# Patient Record
Sex: Male | Born: 1990 | Hispanic: No | Smoking: Current every day smoker
Health system: Southern US, Community
[De-identification: ages and names within clinical notes are randomized; demographics above are authoritative.]

## PROBLEM LIST (undated history)

## (undated) DIAGNOSIS — F419 Anxiety disorder, unspecified: Secondary | ICD-10-CM

## (undated) HISTORY — PX: NO PAST SURGERIES: SHX2092

---
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
## 2017-08-23 ENCOUNTER — Other Ambulatory Visit: Payer: Self-pay

## 2017-08-23 ENCOUNTER — Encounter (HOSPITAL_COMMUNITY): Payer: Self-pay | Admitting: Emergency Medicine

## 2017-08-23 ENCOUNTER — Ambulatory Visit (HOSPITAL_COMMUNITY)
Admission: EM | Admit: 2017-08-23 | Discharge: 2017-08-23 | Disposition: A | Discharge status: Home / Self Care | Payer: BLUE CROSS/BLUE SHIELD | Attending: Family Medicine | Admitting: Family Medicine

## 2017-08-23 DIAGNOSIS — M79672 Pain in left foot: Secondary | ICD-10-CM

## 2017-08-23 HISTORY — DX: Anxiety disorder, unspecified: F41.9

## 2017-08-23 MED ORDER — MELOXICAM 7.5 MG PO TABS: 7.5000 mg | ORAL_TABLET | Freq: Every day | ORAL | 0 refills | Status: DC

## 2017-08-23 NOTE — Discharge Instructions (Signed)
Start mobic for pain and inflammation. Start ice compress, elevation as well. You may need better supportive shoes to help with the symptoms. Follow up with PCP/orthopedics for further evaluation if symptoms do not improve.

## 2017-08-23 NOTE — ED Provider Notes (Signed)
[facility]    CSN: [id number] Arrival date & time: 08/23/17  1615     History   Chief Complaint Chief Complaint  Patient presents with  . Ankle Pain    HPI Gary French is a 27 y.o. male.   27 year old male comes in for 1 day history of left ankle pain.  States that there is no pain when he is nonweightbearing, but has pain during weightbearing, causing him to have trouble walking.  He has not taken anything for the symptoms.  States work requires long hours of standing and walking.  He states he is flat-footed, and mainly walks on his lateral side of the foot. States that he had surgery in the past on the left medial foot, unsure why he had the surgery.  Denies numbness, tingling, new injury.      Past Medical History:  Diagnosis Date  . Anxiety     There are no active problems to display for this patient.   History reviewed. No pertinent surgical history.     Home Medications    Prior to Admission medications   Medication Sig Start Date End Date Taking? Authorizing Provider  meloxicam (MOBIC) 7.5 MG tablet Take 1 tablet (7.5 mg total) by mouth daily. 08/23/17   Belinda Fisher, PA-C    Family History No family history on file.  Social History Social History   Tobacco Use  . Smoking status: Never Smoker  Substance Use Topics  . Alcohol use: Yes  . Drug use: Not on file     Allergies   Patient has no known allergies.   Review of Systems Review of Systems  Reason unable to perform ROS: See HPI as above.     Physical Exam Triage Vital Signs ED Triage Vitals  Enc Vitals Group     BP 08/23/17 1654 139/82     Pulse Rate 08/23/17 1654 85     Resp 08/23/17 1654 14     Temp 08/23/17 1654 98.9 F (37.2 C)     Temp src --      SpO2 08/23/17 1654 100 %     Weight --      Height --      Head Circumference --      Peak Flow --      Pain Score 08/23/17 1655 7     Pain Loc --      Pain Edu? --      Excl. in GC? --    No data  found.  Updated Vital Signs BP 139/82   Pulse 85   Temp 98.9 F (37.2 C)   Resp 14   SpO2 100%   Physical Exam  Constitutional: He is oriented to person, place, and time. He appears well-developed and well-nourished. No distress.  HENT:  Head: Normocephalic and atraumatic.  Eyes: Conjunctivae are normal. Pupils are equal, round, and reactive to light.  Musculoskeletal:  Well healed surgical scar along first MTP of the left foot. No swelling, erythema, increased warmth.  Tenderness on palpation along  third to fifth MTP.  No tenderness on palpation of the ankle.  Full range of motion of ankle and toes.  Sensation intact, slight decrease in sensation at left first MTP along the surgical scar.  Pedal pulses 2+ and equal bilaterally.  Cap refill less than 2 seconds.  Neurological: He is alert and oriented to person, place, and time.     UC Treatments / Results  Labs (all labs ordered are  listed, but only abnormal results are displayed) Labs Reviewed - No data to display  EKG  EKG Interpretation None       Radiology No results found.  Procedures Procedures (including critical care time)  Medications Ordered in UC Medications - No data to display   Initial Impression / Assessment and Plan / UC Course  I have reviewed the triage vital signs and the nursing notes.  Pertinent labs & imaging results that were available during my care of the patient were reviewed by me and considered in my medical decision making (see chart for details).    Start mobic as directed. Ice compress, elevation of foot. Discussed may need better arch support as patient walks primarily on the lateral side of foot, that correlates to where he is symptomatic currently.  Will provide orthopedics information, patient to follow-up for further evaluation needed.  Return precautions given.  Patient expresses understanding and agrees to plan.  Final Clinical Impressions(s) / UC Diagnoses   Final diagnoses:   Left foot pain    ED Discharge Orders        Ordered    meloxicam (MOBIC) 7.5 MG tablet  Daily     08/23/17 1832        Belinda FisherYu, Amy V, PA-C 08/23/17 1840

## 2017-08-23 NOTE — ED Triage Notes (Signed)
Pt c/o L ankle pain, states he has "flat feet" and has feet problems but its worse today. Denies injury.

## 2017-08-31 ENCOUNTER — Other Ambulatory Visit: Payer: Self-pay | Admitting: Sports Medicine

## 2017-08-31 ENCOUNTER — Ambulatory Visit (INDEPENDENT_AMBULATORY_CARE_PROVIDER_SITE_OTHER): Payer: BLUE CROSS/BLUE SHIELD | Admitting: Sports Medicine

## 2017-08-31 ENCOUNTER — Ambulatory Visit (INDEPENDENT_AMBULATORY_CARE_PROVIDER_SITE_OTHER): Payer: Self-pay

## 2017-08-31 DIAGNOSIS — Q663 Other congenital varus deformities of feet, unspecified foot: Secondary | ICD-10-CM

## 2017-08-31 DIAGNOSIS — M25572 Pain in left ankle and joints of left foot: Secondary | ICD-10-CM

## 2017-08-31 DIAGNOSIS — Q6689 Other  specified congenital deformities of feet: Secondary | ICD-10-CM | POA: Insufficient documentation

## 2017-08-31 DIAGNOSIS — M25571 Pain in right ankle and joints of right foot: Secondary | ICD-10-CM

## 2017-08-31 MED ORDER — MELOXICAM 15 MG PO TABS: ORAL_TABLET | ORAL | 3 refills | Status: DC

## 2017-08-31 NOTE — Assessment & Plan Note (Signed)
X-rays of the foot and ankle. Symptoms are bilateral, left worse than right. Meloxicam. Return for custom molded orthotics.

## 2017-08-31 NOTE — Progress Notes (Signed)
Subjective:    I'm seeing this patient as a consultation for: Dr. BrMardella Laymanian Hagler  CC: Bilateral foot pain  HPI: This is a pleasant 27 year old male, for years he has had pain in both feet, left worse than right, localized over the lateral talar dome.  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  Pain is moderate, persistent, worse after a long day on his feet.  No history of clubfoot or clubfoot correction as a child.  Has tried occasional ibuprofen with mild to moderate relief.  Pain is localized without radiation, no trauma.  I reviewed the past medical history, family history, social history, surgical history, and allergies today and no changes were needed.  Please see the problem list section below in epic for further details.  Past Medical History: Past Medical History:  Diagnosis Date  . Anxiety    Past Surgical History: No past surgical history on file. Social History: Social History   Socioeconomic History  . Marital status: Unknown    Spouse name: Not on file  . Number of children: Not on file  . Years of education: Not on file  . Highest education level: Not on file  Social Needs  . Financial resource strain: Not on file  . Food insecurity - worry: Not on file  . Food insecurity - inability: Not on file  . Transportation needs - medical: Not on file  . Transportation needs - non-medical: Not on file  Occupational History  . Not on file  Tobacco Use  . Smoking status: Never Smoker  Substance and Sexual Activity  . Alcohol use: Yes  . Drug use: Not on file  . Sexual activity: Not on file  Other Topics Concern  . Not on file  Social History Narrative  . Not on file   Family History: No family history on file. Allergies: No Known Allergies Medications: See med rec.  Review of Systems: No headache, visual changes, nausea, vomiting, diarrhea, constipation, dizziness, abdominal  pain, skin rash, fevers, chills, night sweats, weight loss, swollen lymph nodes, body aches, joint swelling, muscle aches, chest pain, shortness of breath, mood changes, visual or auditory hallucinations.   Objective:   General: Well Developed, well nourished, and in no acute distress.  Neuro:  Extra-ocular muscles intact, able to move all 4 extremities, sensation grossly intact.  Deep tendon reflexes tested were normal. Psych: Alert and oriented, mood congruent with affect. ENT:  Ears and nose appear unremarkable.  Hearing grossly normal. Neck: Unremarkable overall appearance, trachea midline.  No visible thyroid enlargement. Eyes: Conjunctivae and lids appear unremarkable.  Pupils equal and round. Skin: Warm and dry, no rashes noted.  Cardiovascular: Pulses palpable, no extremity edema. Bilateral feet: No visible erythema or swelling. Somewhat varus appearing foot, tight heel cords with dorsiflexion to neutral. Strength is 5/5 in all directions. No hallux valgus. No pes cavus or pes planus. No abnormal callus noted. No pain over the navicular prominence, or base of fifth metatarsal. No tenderness to palpation of the calcaneal insertion of plantar fascia. No pain at the Achilles insertion. No pain over the calcaneal bursa. No pain of the retrocalcaneal bursa. No tenderness to palpation over the tarsals, metatarsals, or phalanges. No hallux rigidus or limitus. No tenderness palpation over interphalangeal joints. No pain with compression of the metatarsal heads. Neurovascularly intact distally.   Tenderness to palpation over the Lateral ankle mortise anteriorly.  Impression and  Recommendations:   This case required medical decision making of moderate complexity.  Talipes varus with sinus tarsi syndrome X-rays of the foot and ankle. Symptoms are bilateral, left worse than right. Meloxicam. Return for custom molded orthotics. ___________________________________________ Ihor Austin.  Benjamin Stain, M.D., ABFM., CAQSM. Primary Care and Sports Medicine [facility]  Adjunct Instructor of Family Medicine  [employer]

## 2017-09-05 ENCOUNTER — Other Ambulatory Visit: Payer: Self-pay

## 2017-09-05 ENCOUNTER — Encounter (HOSPITAL_COMMUNITY): Payer: Self-pay | Admitting: *Deleted

## 2017-09-05 ENCOUNTER — Ambulatory Visit (HOSPITAL_COMMUNITY)
Admission: EM | Admit: 2017-09-05 | Discharge: 2017-09-05 | Disposition: A | Discharge status: Home / Self Care | Payer: Self-pay | Attending: Family Medicine | Admitting: Family Medicine

## 2017-09-05 DIAGNOSIS — F411 Generalized anxiety disorder: Secondary | ICD-10-CM

## 2017-09-05 MED ORDER — CLONAZEPAM 0.5 MG PO TABS: 0.5000 mg | ORAL_TABLET | Freq: Two times a day (BID) | ORAL | 0 refills | Status: DC | PRN

## 2017-09-05 NOTE — ED Triage Notes (Signed)
Anxiety attack, chest started hurting last night and sob

## 2017-09-05 NOTE — Discharge Instructions (Signed)
We can only treat you with Klonopin, you may take these at the start of a panic attack if needed, but we do not manage anxiety so please keep your appt with new Physician for management of this.

## 2017-09-05 NOTE — ED Provider Notes (Signed)
[facility]    CSN: [id number] Arrival date & time: 09/05/17  1222     History   Chief Complaint Chief Complaint  Patient presents with  . Panic Attack    HPI Gary French is a 27 y.o. male.   Who carries a history of anxiety presents with panic attacks. He reports being diagnosed [place] years ago [place] long ago and has an appt on Tuesday with an establishing PCP. Last night he had a panic attack that he describes is like "all the rest". He would like something if possible just until he can establish care. He also need a work note. He has no suicidal ideations or feelings of harm to others. No additional complaints are noted.       Past Medical History:  Diagnosis Date  . Anxiety     Patient Active Problem List   Diagnosis Date Noted  . 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 No pertinent surgical history.     Home Medications    Prior to Admission medications   Medication Sig Start Date End Date Taking? Authorizing Provider  clonazePAM (KLONOPIN) 0.5 MG tablet Take 1 tablet (0.5 mg total) by mouth 2 (two) times daily as needed for anxiety. 09/05/17   Riki SheerYoung, Michelle G, PA-C  meloxicam (MOBIC) 15 MG tablet One tab PO qAM with breakfast for 2 weeks, then daily prn pain. 08/31/17   Monica Bectonhekkekandam, Thomas J, MD    Family History History reviewed. No pertinent family history.  Social History Social History   Tobacco Use  . Smoking status: Never Smoker  . Smokeless tobacco: Never Used  Substance Use Topics  . Alcohol use: Yes  . Drug use: No     Allergies   Patient has no known allergies.   Review of Systems Review of Systems  All other systems reviewed and are negative.    Physical Exam Triage Vital Signs ED Triage Vitals  Enc Vitals Group     BP 09/05/17 1325 116/79     Pulse Rate 09/05/17 1325 78     Resp 09/05/17 1325 20     Temp 09/05/17 1325 98.1 F (36.7  C)     Temp Source 09/05/17 1325 Oral     SpO2 09/05/17 1325 98 %     Weight --      Height --      Head Circumference --      Peak Flow --      Pain Score 09/05/17 1326 0     Pain Loc --      Pain Edu? --      Excl. in GC? --    No data found.  Updated Vital Signs BP 116/79 (BP Location: Right Arm)   Pulse 78   Temp 98.1 F (36.7 C) (Oral)   Resp 20   SpO2 98%   Visual Acuity Right Eye Distance:   Left Eye Distance:   Bilateral Distance:    Right Eye Near:   Left Eye Near:    Bilateral Near:     Physical Exam  Constitutional: He is oriented to person, place, and time. He appears well-developed and well-nourished. No distress.  Cardiovascular: Normal rate and regular rhythm.  Pulmonary/Chest: Effort normal and breath sounds normal.  Neurological: He is alert and oriented to person, place, and time.  Skin: Skin is warm and dry. He is not diaphoretic.  Psychiatric: His behavior is  normal.  Nursing note and vitals reviewed.    UC Treatments / Results  Labs (all labs ordered are listed, but only abnormal results are displayed) Labs Reviewed - No data to display  EKG  EKG Interpretation None       Radiology No results found.  Procedures Procedures (including critical care time)  Medications Ordered in UC Medications - No data to display   Initial Impression / Assessment and Plan / UC Course  I have reviewed the triage vital signs and the nursing notes.  Pertinent labs & imaging results that were available during my care of the patient were reviewed by me and considered in my medical decision making (see chart for details).     Acute on chronic history of anxiety. RX provided for Klonopin until he can establish care. Did discuss appropriate f/u with his PCP, as we would not prescribe chronic anti-anxiety medications. He expresses understanding.   Final Clinical Impressions(s) / UC Diagnoses   Final diagnoses:  Generalized anxiety disorder     ED Discharge Orders        Ordered    clonazePAM (KLONOPIN) 0.5 MG tablet  2 times daily PRN     09/05/17 1340       Controlled Substance Prescriptions [place] Controlled Substance Registry consulted? Yes, I have consulted the [place] Controlled Substances Registry for this patient, and feel the risk/benefit ratio today is favorable for proceeding with this prescription for a controlled substance.   Riki Sheer, PA-C 09/05/17 1346

## 2017-09-07 ENCOUNTER — Ambulatory Visit: Payer: Self-pay | Admitting: Urgent Care

## 2017-09-14 ENCOUNTER — Encounter: Payer: Self-pay | Admitting: Sports Medicine

## 2017-09-14 ENCOUNTER — Ambulatory Visit (INDEPENDENT_AMBULATORY_CARE_PROVIDER_SITE_OTHER): Payer: Self-pay | Admitting: Sports Medicine

## 2017-09-14 DIAGNOSIS — Q6689 Other  specified congenital deformities of feet: Secondary | ICD-10-CM

## 2017-09-14 NOTE — Assessment & Plan Note (Signed)
Navicular/medial cuneiform tarsal coalition. Custom orthotics as above. Return in 1 month, NSAIDs are helping significantly.

## 2017-09-14 NOTE — Progress Notes (Signed)
    Patient was fitted for a : standard, cushioned, semi-rigid orthotic. The orthotic was heated and afterward the patient stood on the orthotic blank positioned on the orthotic stand. The patient was positioned in subtalar neutral position and 10 degrees of ankle dorsiflexion in a weight bearing stance. After completion of molding, a stable base was applied to the orthotic blank. The blank was ground to a stable position for weight bearing. Size: 13 Base: White EVA Additional Posting and Padding: None The patient ambulated these, and they were very comfortable.  I spent 40 minutes with this patient, greater than 50% was face-to-face time counseling regarding the below diagnosis.  ___________________________________________ Raneshia Derick J. Lamon Rotundo, M.D., ABFM., CAQSM. Primary Care and Sports Medicine [facility]  Adjunct Instructor of Family Medicine  [employer]   

## 2017-09-21 ENCOUNTER — Ambulatory Visit: Payer: Self-pay | Admitting: Urgent Care

## 2017-10-12 ENCOUNTER — Ambulatory Visit: Payer: Self-pay | Admitting: Sports Medicine

## 2017-10-12 DIAGNOSIS — Z0189 Encounter for other specified special examinations: Secondary | ICD-10-CM

## 2017-11-21 ENCOUNTER — Ambulatory Visit (HOSPITAL_COMMUNITY)
Admission: EM | Admit: 2017-11-21 | Discharge: 2017-11-21 | Disposition: A | Discharge status: Home / Self Care | Payer: Self-pay | Attending: Internal Medicine | Admitting: Internal Medicine

## 2017-11-21 ENCOUNTER — Encounter (HOSPITAL_COMMUNITY): Payer: Self-pay | Admitting: Emergency Medicine

## 2017-11-21 DIAGNOSIS — J209 Acute bronchitis, unspecified: Secondary | ICD-10-CM

## 2017-11-21 DIAGNOSIS — J069 Acute upper respiratory infection, unspecified: Secondary | ICD-10-CM

## 2017-11-21 MED ORDER — PREDNISONE 20 MG PO TABS: 40.0000 mg | ORAL_TABLET | Freq: Every day | ORAL | 0 refills | Status: AC

## 2017-11-21 MED ORDER — ALBUTEROL SULFATE HFA 108 (90 BASE) MCG/ACT IN AERS: 1.0000 | INHALATION_SPRAY | Freq: Four times a day (QID) | RESPIRATORY_TRACT | 0 refills | Status: DC | PRN

## 2017-11-21 MED ORDER — AZITHROMYCIN 250 MG PO TABS: ORAL_TABLET | ORAL | 0 refills | Status: AC

## 2017-11-21 NOTE — Discharge Instructions (Signed)
Push fluids to ensure adequate hydration and keep secretions thin.  Tylenol and/or ibuprofen as needed for pain or fevers.  Complete course of antibiotics.  Complete 5 days of prednisone. Inhaler as needed for wheezing or shortness of breath.  Continue to decrease to quit smoke as this may likely exacerbate symptoms. If symptoms worsen or do not improve in the next week to return to be seen or to follow up with your PCP.

## 2017-11-21 NOTE — ED Provider Notes (Signed)
[facility]    CSN: [id number] Arrival date & time: 11/21/17  1934     History   Chief Complaint Chief Complaint  Patient presents with  . Sore Throat    HPI Gary French is a 27 y.o. male.   Gary French presents with complaints of 1 week of non productive cough, subjective fevers and sore throat throat. Denies ear pain or known ill contacts. Denies rash, gi/gu complaints. Smokes approximately 4 cigarettes a day. Has been taking allergy medication which has helped some. Slight headache. Occasionally shortness of breath.  History of anxiety.   ROS per HPI.      Past Medical History:  Diagnosis Date  . Anxiety     Patient Active Problem List   Diagnosis Date Noted  . 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 No pertinent surgical history.     Home Medications    Prior to Admission medications   Medication Sig Start Date End Date Taking? Authorizing Provider  albuterol (PROVENTIL HFA;VENTOLIN HFA) 108 (90 Base) MCG/ACT inhaler Inhale 1-2 puffs into the lungs every 6 (six) hours as needed for wheezing or shortness of breath. 11/21/17   Georgetta HaberBurky, Amareon Phung B, NP  azithromycin (ZITHROMAX) 250 MG tablet Take 2 tablets (500 mg total) by mouth daily for 1 day, THEN 1 tablet (250 mg total) daily for 4 days. 11/21/17 11/26/17  Georgetta HaberBurky, Lateasha Breuer B, NP  clonazePAM (KLONOPIN) 0.5 MG tablet Take 1 tablet (0.5 mg total) by mouth 2 (two) times daily as needed for anxiety. 09/05/17   Riki SheerYoung, Michelle G, PA-C  meloxicam (MOBIC) 15 MG tablet One tab PO qAM with breakfast for 2 weeks, then daily prn pain. 08/31/17   Monica Bectonhekkekandam, Thomas J, MD  predniSONE (DELTASONE) 20 MG tablet Take 2 tablets (40 mg total) by mouth daily with breakfast for 5 days. 11/21/17 11/26/17  Georgetta HaberBurky, Makhi Muzquiz B, NP    Family History History reviewed. No pertinent family history.  Social History Social History   Tobacco Use  . Smoking status: Never Smoker  . Smokeless tobacco: Never Used    Substance Use Topics  . Alcohol use: Yes  . Drug use: No     Allergies   Patient has no known allergies.   Review of Systems Review of Systems   Physical Exam Triage Vital Signs ED Triage Vitals [11/21/17 2006]  Enc Vitals Group     BP 127/81     Pulse Rate 100     Resp 16     Temp 98.5 F (36.9 C)     Temp src      SpO2 100 %     Weight      Height      Head Circumference      Peak Flow      Pain Score      Pain Loc      Pain Edu?      Excl. in GC?    No data found.  Updated Vital Signs BP 127/81   Pulse 100   Temp 98.5 F (36.9 C)   Resp 16   SpO2 100%   Visual Acuity Right Eye Distance:   Left Eye Distance:   Bilateral Distance:    Right Eye Near:   Left Eye Near:    Bilateral Near:     Physical Exam  Constitutional: He is oriented to person, place, and time. He appears well-developed and well-nourished.  HENT:  Head: Normocephalic and atraumatic.  Right Ear: Tympanic membrane, external  ear and ear canal normal.  Left Ear: Tympanic membrane, external ear and ear canal normal.  Nose: Nose normal. Right sinus exhibits no maxillary sinus tenderness and no frontal sinus tenderness. Left sinus exhibits no maxillary sinus tenderness and no frontal sinus tenderness.  Mouth/Throat: Uvula is midline and mucous membranes are normal. Posterior oropharyngeal erythema present. Tonsils are 1+ on the right. Tonsils are 1+ on the left. No tonsillar exudate.  Eyes: Pupils are equal, round, and reactive to light. Conjunctivae are normal.  Neck: Normal range of motion.  Cardiovascular: Normal rate and regular rhythm.  Pulmonary/Chest: Effort normal. He has wheezes in the right upper field, the right lower field, the left upper field and the left lower field.  Late inspiratory wheezes noted throughout; strong cough noted  Lymphadenopathy:    He has no cervical adenopathy.  Neurological: He is alert and oriented to person, place, and time.  Skin: Skin is warm and  dry.  Vitals reviewed.    UC Treatments / Results  Labs (all labs ordered are listed, but only abnormal results are displayed) Labs Reviewed - No data to display  EKG None Radiology No results found.  Procedures Procedures (including critical care time)  Medications Ordered in UC Medications - No data to display   Initial Impression / Assessment and Plan / UC Course  I have reviewed the triage vital signs and the nursing notes.  Pertinent labs & imaging results that were available during my care of the patient were reviewed by me and considered in my medical decision making (see chart for details).     Non toxic in appearance. Lungs with wheezing noted and with frequent cough, persistent for at least the past week with uri symptoms as well. Will cover with azithromycin, 5 days of prednisone, use of albuterol inhaler as needed. Encouraged decrease to stop smoking. Return precautions provided. Patient verbalized understanding and agreeable to plan.    Final Clinical Impressions(s) / UC Diagnoses   Final diagnoses:  Upper respiratory tract infection, unspecified type  Acute bronchitis, unspecified organism    ED Discharge Orders        Ordered    azithromycin (ZITHROMAX) 250 MG tablet     11/21/17 2021    predniSONE (DELTASONE) 20 MG tablet  Daily with breakfast     11/21/17 2022    albuterol (PROVENTIL HFA;VENTOLIN HFA) 108 (90 Base) MCG/ACT inhaler  Every 6 hours PRN     11/21/17 2022       Controlled Substance Prescriptions [place] Controlled Substance Registry consulted? Not Applicable   Georgetta Haber, NP 11/21/17 2029

## 2017-11-21 NOTE — ED Triage Notes (Signed)
Pt c/o sore throat x 1 week

## 2018-03-20 ENCOUNTER — Ambulatory Visit (HOSPITAL_COMMUNITY)
Admission: EM | Admit: 2018-03-20 | Discharge: 2018-03-20 | Disposition: A | Discharge status: Home / Self Care | Payer: BLUE CROSS/BLUE SHIELD

## 2018-03-20 ENCOUNTER — Encounter (HOSPITAL_COMMUNITY): Payer: Self-pay | Admitting: Emergency Medicine

## 2018-03-20 DIAGNOSIS — R51 Headache: Secondary | ICD-10-CM

## 2018-03-20 DIAGNOSIS — R519 Headache, unspecified: Secondary | ICD-10-CM

## 2018-03-20 NOTE — ED Provider Notes (Signed)
[facility]    CSN: [id number] Arrival date & time: 03/20/18  1713     History   Chief Complaint Chief Complaint  Patient presents with  . Headache  . Fatigue  . Letter for School/Work    HPI Gary French is a 27 y.o. male.   27 year old male comes in for work note.  States had a bad headache yesterday, left-sided, pounding in sensation.  Denies nausea, vomiting.  States headache lasted till earlier today, and is now in minimal pain.  Needs a work note as he missed work yesterday due to the pain.  Denies URI symptoms such as cough, congestion, sore throat.  Denies fever, chills, night sweats.  Denies weakness, dizziness, syncope.  Take ibuprofen 600 mg with good relief.      Past Medical History:  Diagnosis Date  . Anxiety     Patient Active Problem List   Diagnosis Date Noted  . 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 No pertinent surgical history.     Home Medications    Prior to Admission medications   Medication Sig Start Date End Date Taking? Authorizing Provider  albuterol (PROVENTIL HFA;VENTOLIN HFA) 108 (90 Base) MCG/ACT inhaler Inhale 1-2 puffs into the lungs every 6 (six) hours as needed for wheezing or shortness of breath. 11/21/17   Georgetta HaberBurky, Natalie B, NP  clonazePAM (KLONOPIN) 0.5 MG tablet Take 1 tablet (0.5 mg total) by mouth 2 (two) times daily as needed for anxiety. 09/05/17   Riki SheerYoung, Michelle G, PA-C  meloxicam (MOBIC) 15 MG tablet One tab PO qAM with breakfast for 2 weeks, then daily prn pain. 08/31/17   Monica Bectonhekkekandam, Thomas J, MD    Family History History reviewed. No pertinent family history.  Social History Social History   Tobacco Use  . Smoking status: Never Smoker  . Smokeless tobacco: Never Used  Substance Use Topics  . Alcohol use: Yes  . Drug use: No     Allergies   Patient has no known allergies.   Review of Systems Review of Systems  Reason unable to perform ROS: See HPI as above.      Physical Exam Triage Vital Signs ED Triage Vitals  Enc Vitals Group     BP 03/20/18 1800 124/71     Pulse Rate 03/20/18 1800 81     Resp 03/20/18 1800 16     Temp 03/20/18 1800 98.5 F (36.9 C)     Temp src --      SpO2 03/20/18 1800 99 %     Weight --      Height --      Head Circumference --      Peak Flow --      Pain Score 03/20/18 1801 3     Pain Loc --      Pain Edu? --      Excl. in GC? --    No data found.  Updated Vital Signs BP 124/71   Pulse 81   Temp 98.5 F (36.9 C)   Resp 16   SpO2 99%   Physical Exam  Constitutional: He is oriented to person, place, and time. He appears well-developed and well-nourished.  Non-toxic appearance. He does not appear ill. No distress.  HENT:  Head: Normocephalic and atraumatic.  Eyes: Pupils are equal, round, and reactive to light. Conjunctivae and EOM are normal.  Neck: Normal range of motion. Neck supple.  Cardiovascular: Normal rate, regular rhythm and normal heart sounds. Exam  reveals no gallop and no friction rub.  No murmur heard. Pulmonary/Chest: Effort normal and breath sounds normal. No accessory muscle usage or stridor. No respiratory distress. He has no decreased breath sounds. He has no wheezes. He has no rhonchi. He has no rales.  Neurological: He is alert and oriented to person, place, and time. He has normal strength. He is not disoriented. No cranial nerve deficit or sensory deficit. Coordination and gait normal. GCS eye subscore is 4. GCS verbal subscore is 5. GCS motor subscore is 6.  Normal finger to nose, rapid movement.   Skin: Skin is warm and dry. He is not diaphoretic.     UC Treatments / Results  Labs (all labs ordered are listed, but only abnormal results are displayed) Labs Reviewed - No data to display  EKG None  Radiology No results found.  Procedures Procedures (including critical care time)  Medications Ordered in UC Medications - No data to display  Initial Impression /  Assessment and Plan / UC Course  I have reviewed the triage vital signs and the nursing notes.  Pertinent labs & imaging results that were available during my care of the patient were reviewed by me and considered in my medical decision making (see chart for details).    No alarming signs on exam. Given minimal symptoms, continue NSAIDs for pain. Push fluids. Return precautions given.  Final Clinical Impressions(s) / UC Diagnoses   Final diagnoses:  Acute intractable headache, unspecified headache type    ED Prescriptions    None        Belinda FisherYu, Sequoyah Ramone V, PA-C 03/20/18 1819

## 2018-03-20 NOTE — ED Triage Notes (Addendum)
Pt c/o bad headache last night, states it feels better but now he just feels weak. Pt states he had to call out of work and now just needs a doctors note for his job.

## 2018-03-20 NOTE — Discharge Instructions (Signed)
Continue ibuprofen as needed for headache. If experiencing worsening of symptoms, headache/blurry vision, nausea/vomiting, confusion/altered mental status, dizziness, weakness, passing out, imbalance, go to the emergency department for further evaluation.

## 2018-08-01 ENCOUNTER — Other Ambulatory Visit: Payer: Self-pay

## 2018-08-01 ENCOUNTER — Ambulatory Visit (HOSPITAL_COMMUNITY)
Admission: EM | Admit: 2018-08-01 | Discharge: 2018-08-01 | Disposition: A | Discharge status: Home / Self Care | Payer: Self-pay | Attending: Family Medicine | Admitting: Family Medicine

## 2018-08-01 ENCOUNTER — Encounter (HOSPITAL_COMMUNITY): Payer: Self-pay

## 2018-08-01 DIAGNOSIS — F411 Generalized anxiety disorder: Secondary | ICD-10-CM | POA: Insufficient documentation

## 2018-08-01 MED ORDER — SERTRALINE HCL 50 MG PO TABS: 50.0000 mg | ORAL_TABLET | Freq: Every day | ORAL | 1 refills | Status: DC

## 2018-08-01 NOTE — Discharge Instructions (Signed)
Take the sertraline every day Do not run out Avoid caffeine Get enough exercise Call this week to set up an appointment with a PCP - do not wait until medicine runs low

## 2018-08-01 NOTE — ED Provider Notes (Signed)
[facility]    CSN: [id number] Arrival date & time: 08/01/18  1231     History   Chief Complaint Chief Complaint  Patient presents with  . Panic Attack    HPI Gary French is a 27 y.o. male.   HPI  She has a chronic anxiety condition.  For years he took sertraline.  He took himself off of it a couple years ago because he thought it was not working.  Now he is having increasing anxiety and increasing panic attacks.  It is affecting his ability to work.  He is requesting referral to a PCP, and a refill of his sertraline.  He lives with his parents.  They are supportive.  He has missed time from work because of his anxiety.  He is requesting a note for work stating he is under treatment for anxiety, and although I explained we do not usually put the diagnosis on a work note he requests this. No thoughts of harming self or others. He is not sleeping well.  Past Medical History:  Diagnosis Date  . Anxiety     Patient Active Problem List   Diagnosis Date Noted  . 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 No pertinent surgical history.     Home Medications    Prior to Admission medications   Medication Sig Start Date End Date Taking? Authorizing Provider  albuterol (PROVENTIL HFA;VENTOLIN HFA) 108 (90 Base) MCG/ACT inhaler Inhale 1-2 puffs into the lungs every 6 (six) hours as needed for wheezing or shortness of breath. 11/21/17   Linus MakoBurky, Natalie B, NP  sertraline (ZOLOFT) 50 MG tablet Take 1 tablet (50 mg total) by mouth daily. 08/01/18   Eustace MooreNelson, Yvonne Sue, MD    Family History History reviewed. No pertinent family history.  Social History Social History   Tobacco Use  . Smoking status: Never Smoker  . Smokeless tobacco: Never Used  Substance Use Topics  . Alcohol use: Yes  . Drug use: No     Allergies   Patient has no known allergies.   Review of Systems Review of Systems  Constitutional: Negative for chills and fever.    HENT: Negative for ear pain and sore throat.   Eyes: Negative for pain and visual disturbance.  Respiratory: Negative for cough and shortness of breath.   Cardiovascular: Negative for chest pain and palpitations.  Gastrointestinal: Negative for abdominal pain and vomiting.  Genitourinary: Negative for dysuria and hematuria.  Musculoskeletal: Negative for arthralgias and back pain.  Skin: Negative for color change and rash.  Neurological: Negative for seizures and syncope.  Psychiatric/Behavioral: Positive for dysphoric mood. The patient is nervous/anxious.   All other systems reviewed and are negative.    Physical Exam Triage Vital Signs ED Triage Vitals  Enc Vitals Group     BP 08/01/18 1440 116/64     Pulse Rate 08/01/18 1440 92     Resp 08/01/18 1440 18     Temp 08/01/18 1440 98.1 F (36.7 C)     Temp Source 08/01/18 1440 Oral     SpO2 08/01/18 1440 97 %     Weight 08/01/18 1439 255 lb (115.7 kg)     Height --      Head Circumference --      Peak Flow --      Pain Score 08/01/18 1439 1     Pain Loc --      Pain Edu? --      Excl. in GC? --  No data found.  Updated Vital Signs BP 116/64 (BP Location: Right Arm)   Pulse 92   Temp 98.1 F (36.7 C) (Oral)   Resp 18   Wt 115.7 kg   SpO2 97%      Physical Exam Constitutional:      General: He is not in acute distress.    Appearance: He is well-developed.  HENT:     Head: Normocephalic and atraumatic.     Comments: Large scar on right scalp at hairline (brain surgery) Eyes:     Conjunctiva/sclera: Conjunctivae normal.     Pupils: Pupils are equal, round, and reactive to light.  Neck:     Musculoskeletal: Normal range of motion.  Cardiovascular:     Rate and Rhythm: Normal rate.  Pulmonary:     Effort: Pulmonary effort is normal. No respiratory distress.  Abdominal:     General: There is no distension.     Palpations: Abdomen is soft.  Musculoskeletal: Normal range of motion.  Skin:    General: Skin  is warm and dry.  Neurological:     General: No focal deficit present.     Mental Status: He is alert. Mental status is at baseline.  Psychiatric:        Behavior: Behavior normal.        Thought Content: Thought content normal.     Comments: poor eye contact.  Limited fund of knowledge.  Mildly hyperkinetic      UC Treatments / Results  Labs (all labs ordered are listed, but only abnormal results are displayed) Labs Reviewed - No data to display  EKG None  Radiology No results found.  Procedures Procedures (including critical care time)  Medications Ordered in UC Medications - No data to display  Initial Impression / Assessment and Plan / UC Course  I have reviewed the triage vital signs and the nursing notes.  Pertinent labs & imaging results that were available during my care of the patient were reviewed by me and considered in my medical decision making (see chart for details).     I emphasized the importance of patient seeing a PCP for ongoing care and staying on this medication.  The PCP can refer him for counseling if this is indicated.  At his request he is given a work note stating that he is under care for anxiety.  He feels this is necessary for his employer. Final Clinical Impressions(s) / UC Diagnoses   Final diagnoses:  GAD (generalized anxiety disorder)     Discharge Instructions     Take the sertraline every day Do not run out Avoid caffeine Get enough exercise Call this week to set up an appointment with a PCP - do not wait until medicine runs low    ED Prescriptions    Medication Sig Dispense Auth. Provider   sertraline (ZOLOFT) 50 MG tablet Take 1 tablet (50 mg total) by mouth daily. 30 tablet Eustace MooreNelson, Yvonne Sue, MD     Controlled Substance Prescriptions [place] Controlled Substance Registry consulted? Not Applicable   Eustace MooreNelson, Yvonne Sue, MD 08/01/18 [id number]

## 2018-08-01 NOTE — ED Triage Notes (Signed)
Pt states she has been having anxiety attacks x 3 days.

## 2019-04-28 IMAGING — DX DG ANKLE COMPLETE 3+V*L*
3 series · 3 of 3 positions shown · non-contrast
Comparison: None.

CLINICAL DATA: C/o LT ankle pain and swelling x several months.

EXAM:
LEFT ANKLE COMPLETE - 3+ VIEW

[ankle ap]
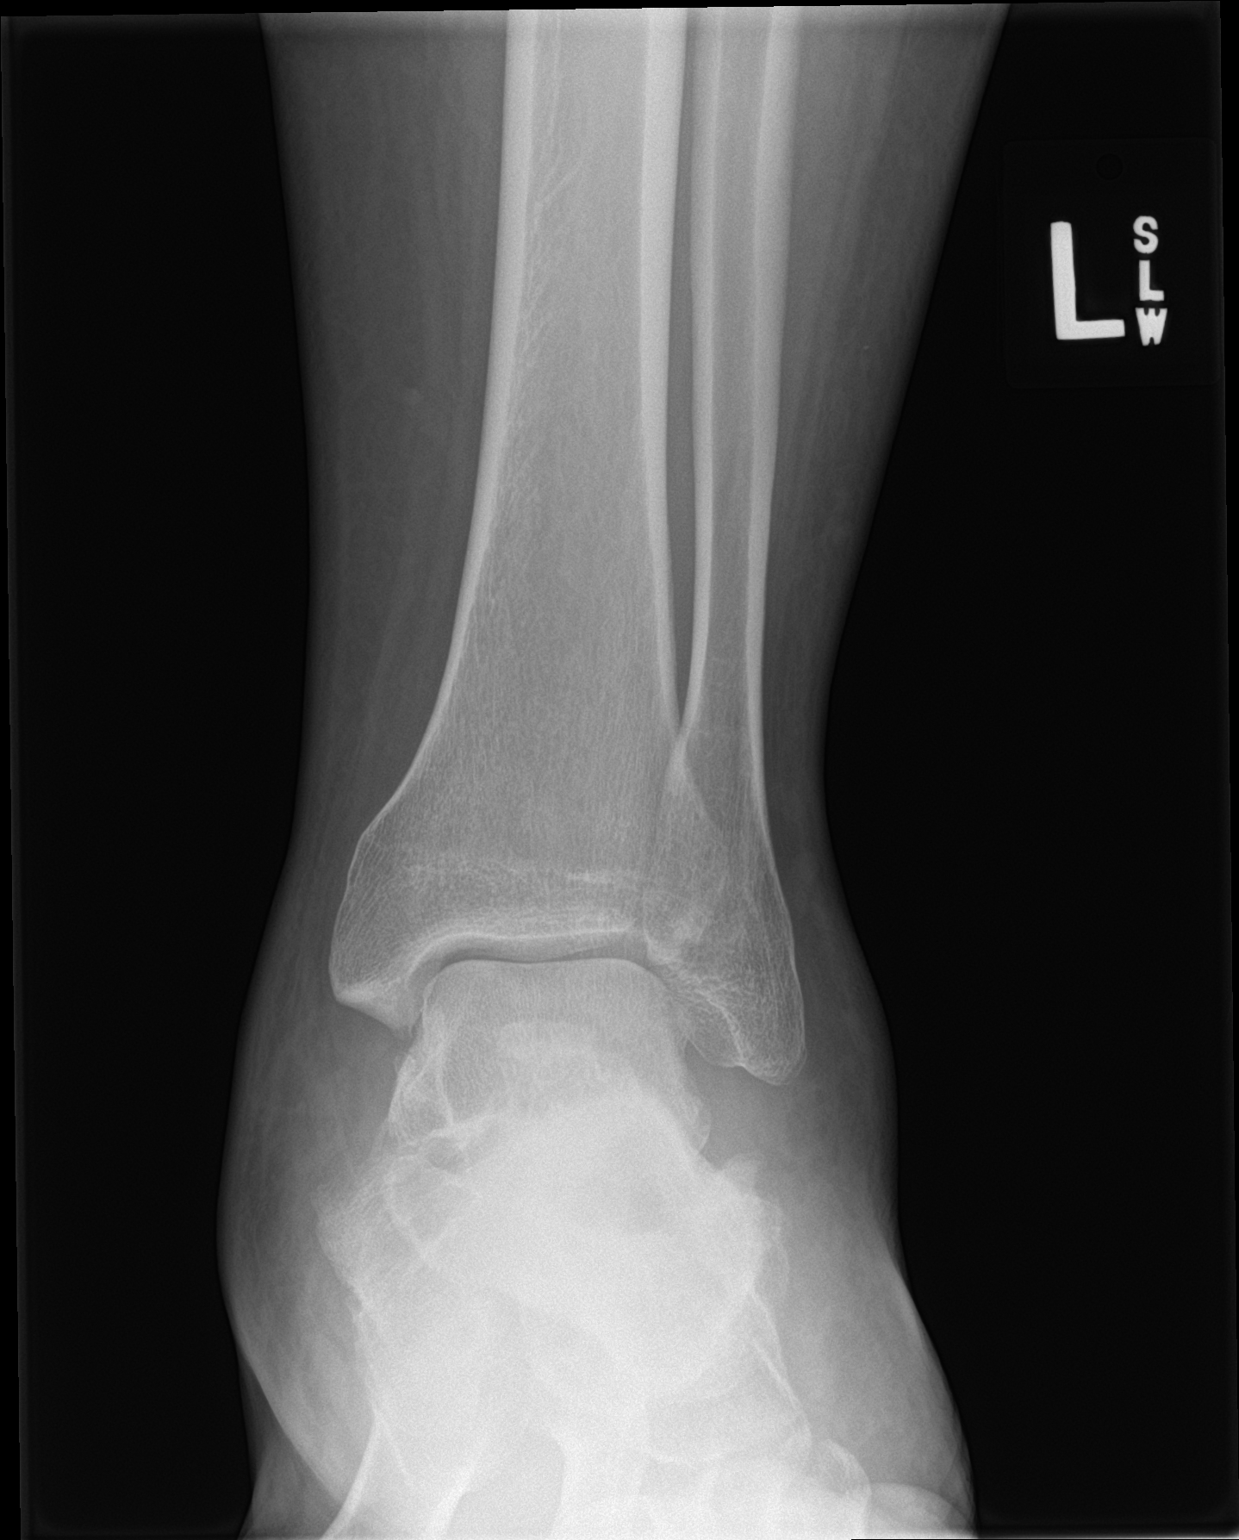

[ankle obl]
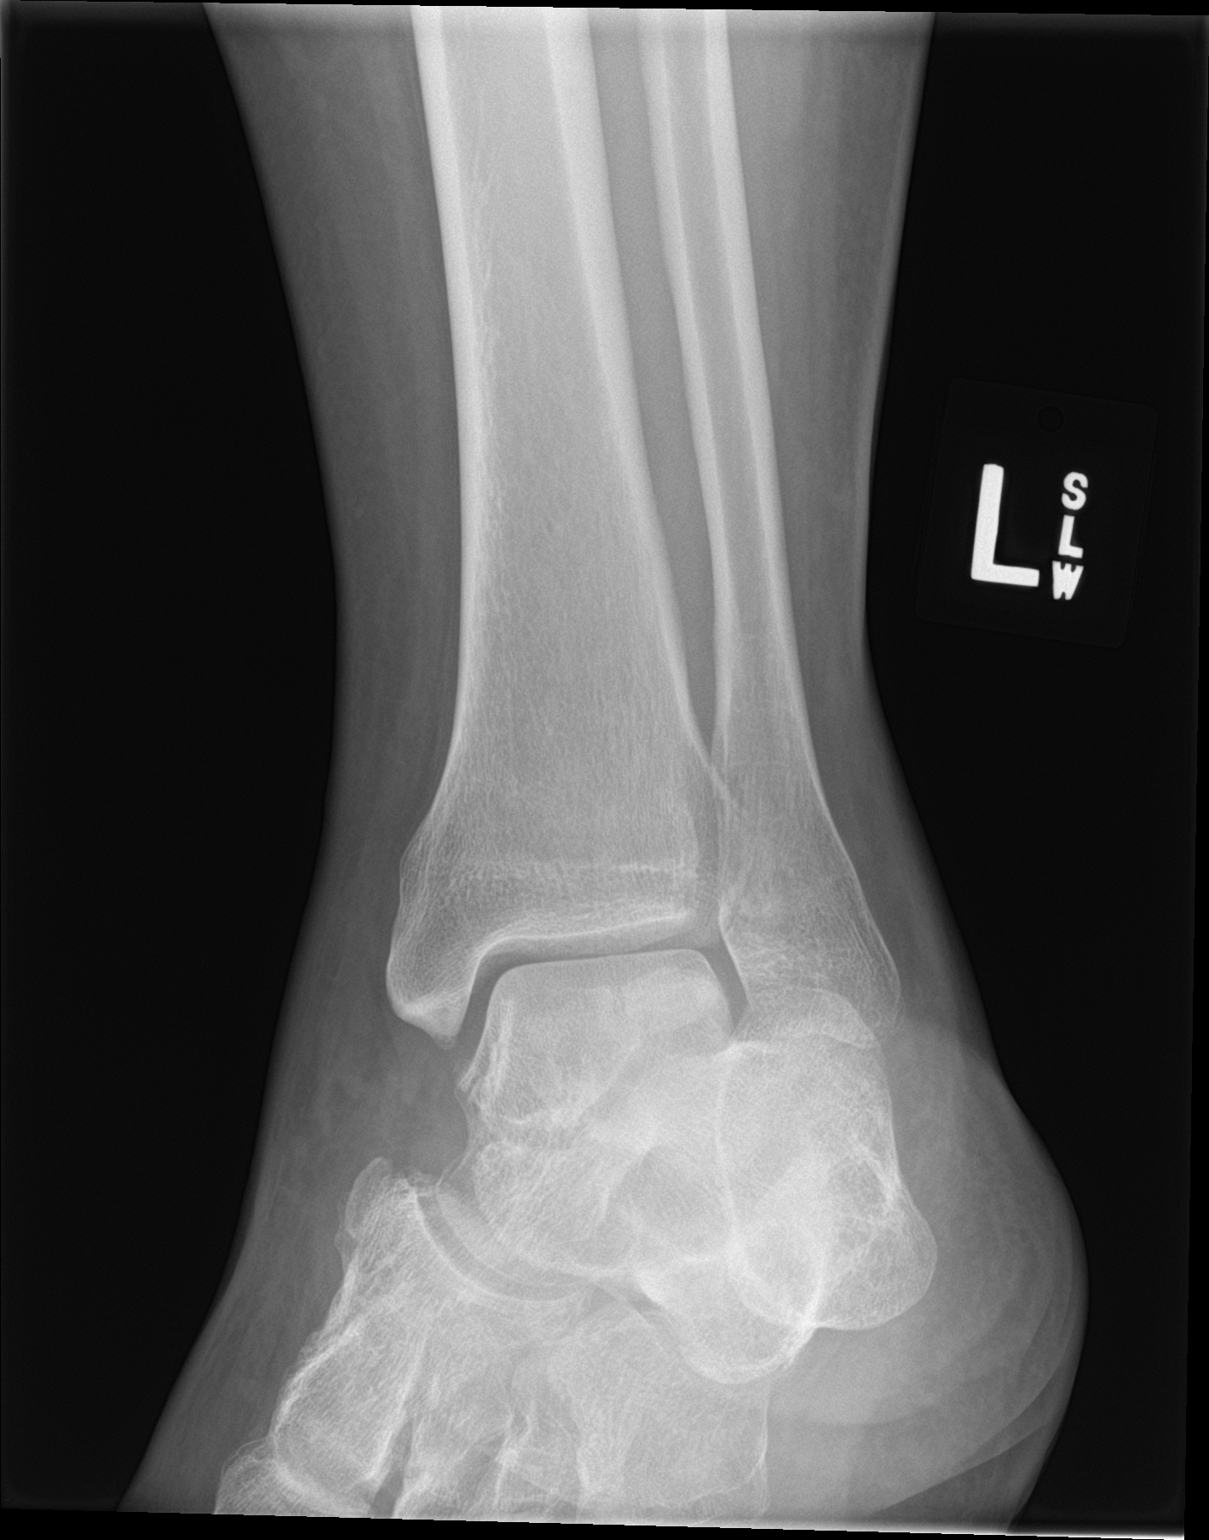

[ankle lat]
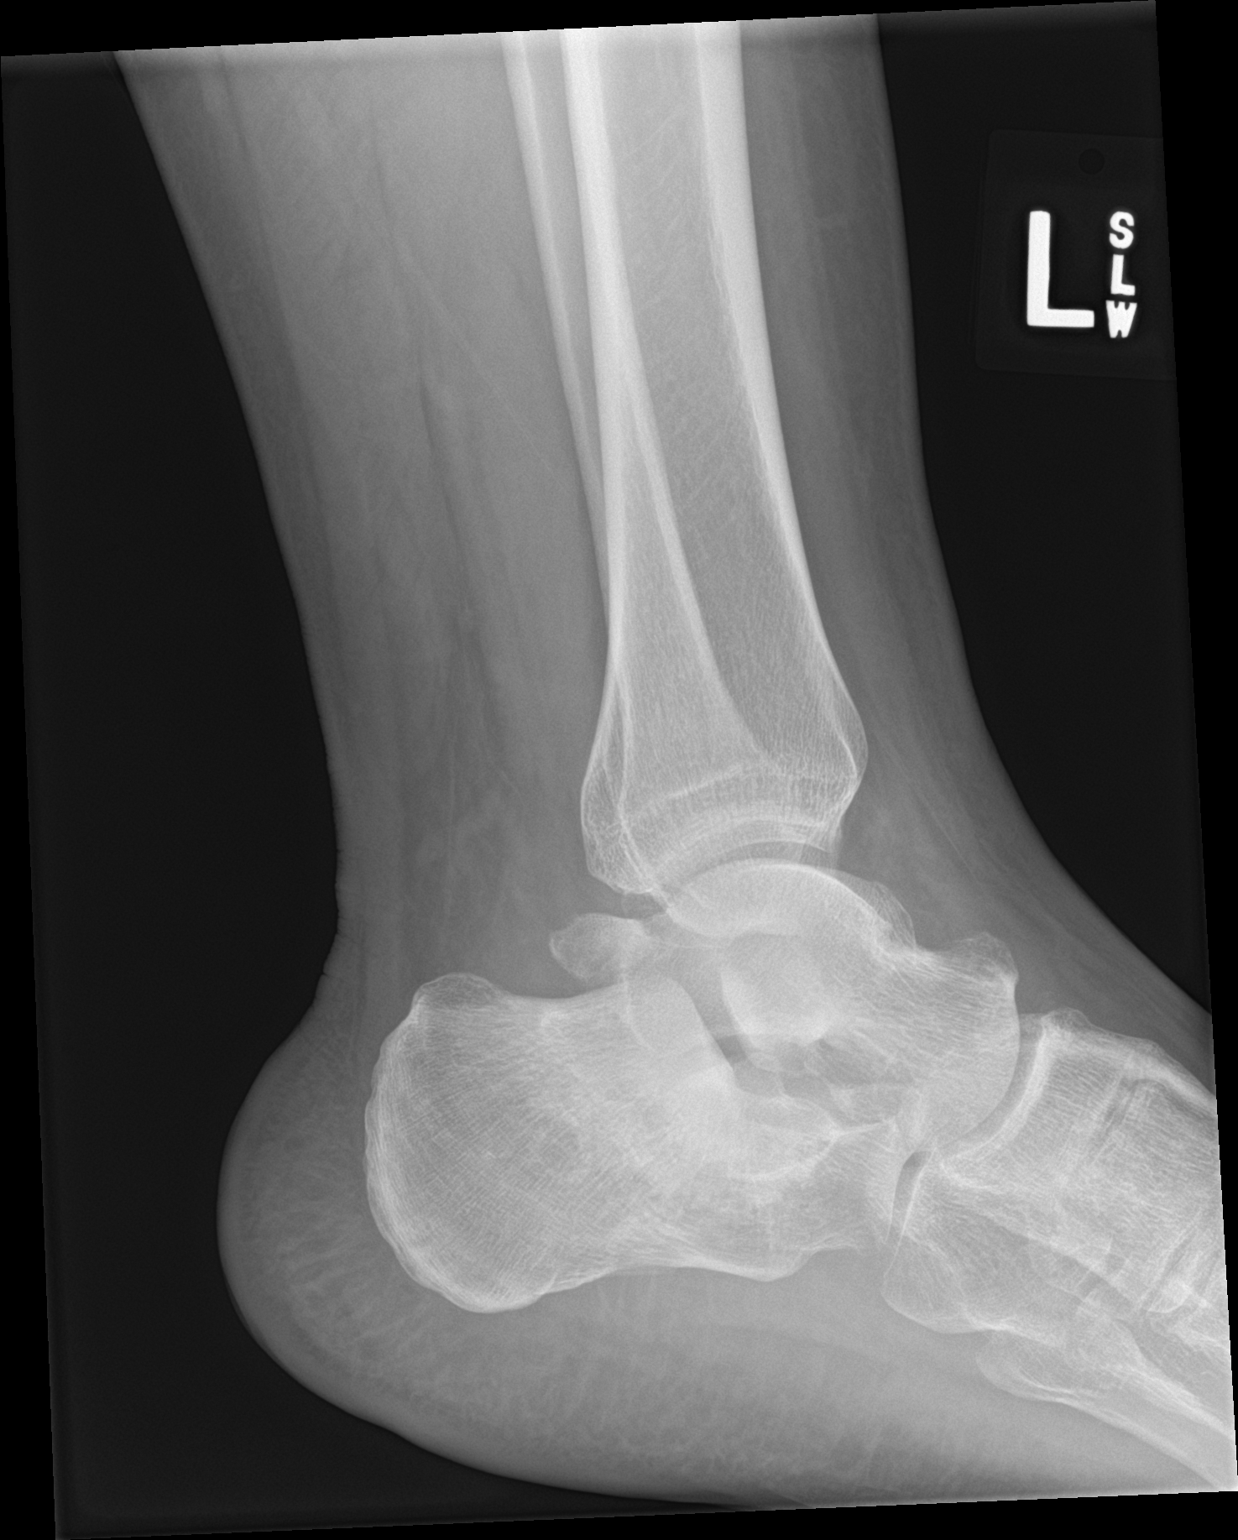

[3 of 3 positions shown; findings below may reference images not displayed]

FINDINGS: No fracture.  No bone lesion.

The ankle mortise is normally spaced and aligned. No arthropathic
change.

There is a bony prominence from the anterior, dorsal talus at the
talonavicular joint.

Mild diffuse soft tissue edema is noted, nonspecific.
IMPRESSION: 1. No fracture, bone lesion or ankle joint abnormality.

## 2020-08-26 ENCOUNTER — Other Ambulatory Visit: Payer: Self-pay

## 2020-08-26 DIAGNOSIS — Z20822 Contact with and (suspected) exposure to covid-19: Secondary | ICD-10-CM

## 2020-08-27 LAB — SARS-COV-2, NAA 2 DAY TAT

## 2020-08-27 LAB — NOVEL CORONAVIRUS, NAA: SARS-CoV-2, NAA: DETECTED — AB

## 2020-09-02 ENCOUNTER — Other Ambulatory Visit: Payer: Self-pay

## 2020-09-02 DIAGNOSIS — Z20822 Contact with and (suspected) exposure to covid-19: Secondary | ICD-10-CM

## 2020-09-03 LAB — NOVEL CORONAVIRUS, NAA: SARS-CoV-2, NAA: DETECTED — AB

## 2020-09-03 LAB — SARS-COV-2, NAA 2 DAY TAT

## 2020-12-22 NOTE — Progress Notes (Signed)
Virtual Visit via Telephone Note  I connected with Gary French, on 12/23/2020 at 3:40 PM by telephone due to the COVID-19 pandemic and verified that I am speaking with the correct person using two identifiers.  Due to current restrictions/limitations of in-office visits due to the COVID-19 pandemic, this scheduled clinical appointment was converted to a telehealth visit.   Consent: I discussed the limitations, risks, security and privacy concerns of performing an evaluation and management service by telephone and the availability of in person appointments. I also discussed with the patient that there may be a patient responsible charge related to this service. The patient expressed understanding and agreed to proceed.   Location of Patient: Home  Location of Provider: [facility]  Persons participating in Telemedicine visit: [place] Ricky Stabs, NP Margorie John, CMA  History of Present Illness: Gary French is a 30 year-old male who presents to establish care. PMH significant for anxiety and congenital tarsal coalition.  Current issues and/or concerns: 1. ANXIETY: Reports was taking Sertraline and the last time being around 2019. Reports he lost track of care. Ready to resume medication.  Anxious mood: yes, reports primarily related to when his sister passed away when he was about 74 years old.  Excessive worrying: yes Irritability: yes  Sweating: no Nausea: no Palpitations:no Hyperventilation: no Panic attacks: no Depressed mood: sometimes  Impaired concentration/indecisiveness: no Suicidal ideations, homicidal ideations, self-harm: no  Recent Stressors/Life Changes: yes   Relationship problems: no   Family stress: no     Financial stress: no    Job stress: no    Recent death/loss: yes Depression screen [facility] 2/9 12/23/2020  Decreased Interest 0  Down, Depressed, Hopeless 0  PHQ - 2 Score 0  Altered sleeping 0  Tired,  decreased energy 0  Change in appetite 0  Feeling bad or failure about yourself  0  Trouble concentrating 0  Moving slowly or fidgety/restless 0  Suicidal thoughts 0  PHQ-9 Score 0  Difficult doing work/chores Not difficult at all   2. 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 Right ear needs hearing aid. Reports began in early childhood around Kindergarten age. Requesting referral.  3. FLAT FEET: Requesting referral to foot doctor.   Past Medical History:  Diagnosis Date  . Anxiety    No Known Allergies  Current Outpatient Medications on File Prior to Visit  Medication Sig Dispense Refill  . albuterol (PROVENTIL HFA;VENTOLIN HFA) 108 (90 Base) MCG/ACT inhaler Inhale 1-2 puffs into the lungs every 6 (six) hours as needed for wheezing or shortness of breath. 1 Inhaler 0  . sertraline (ZOLOFT) 50 MG tablet Take 1 tablet (50 mg total) by mouth daily. 30 tablet 1   No current facility-administered medications on file prior to visit.    Observations/Objective: Alert and oriented x 3. Not in acute distress. Physical examination not completed as this is a telemedicine visit.  Assessment and Plan: 1. Encounter to establish care: - Patient presents today to establish care.  - Return for annual physical examination, labs, and health maintenance. Arrive fasting meaning having no food for at least 8 hours prior to appointment. You may have only water or black coffee. Please take scheduled medications as normal.  2. Anxiety and depression: - Stable.  - Denies thoughts of self-harm, suicidal ideations, and homicidal ideations.  - Begin Sertraline as prescribed.   Avoid driving or hazardous activity until you know how this medication will affect you. Your reactions  could be impaired. Dizziness or fainting can cause falls, accidents, or severe injuries.  Common side effects include drowsiness, nausea, constipation, loss of appetite, dry mouth, increased sweating.  Call your  provider if you have pounding heartbeats or fluttering in your chest, a light-headed feeling like you may pass out, easy bruising/unusal bleeding, vision change, difficult or painful urination, impotence/sexual problems, liver problems (right-sided upper stomach pain, itching, dark urine, yellowing of skin or eyes/jaundice, low levels of sodium in the body (headache, confusion, slurred speech, severe weakness, vomiting, loss of coordination, feeling unsteady), or manic episodes (racing thoughts, increased energy, decreased need for sleep, risk-taking behavior, being agitated, talkative)  Seek medical attention immediately if you have symptoms of serotonin syndrome such as agitation, hallucinations, fever, sweating, shivering, fast heart rate, muscle stiffness, twitching, loss of coordination, nausea, vomiting, or diarrhea  Report any new or worsening symptoms to your provider, such as but not limited to: mood or behavior changes, anxiety, panic attacks, trouble sleeping, or if you feel impulsive, irritable, agitated, hostile, aggressive, restless, hyperactive (mentally or physically), more depressed, or have thoughts about suicide or hurting yourself - Follow-up with primary provider in 4 weeks or sooner if needed.  - sertraline (ZOLOFT) 25 MG tablet; Take 1 tablet (25 mg total) by mouth daily.  Dispense: 30 tablet; Refill: 0  3. Flat feet, bilateral: - Per patient request referral to Podiatry for further evaluation and management. - Ambulatory referral to Podiatry  4. Bilateral hearing loss, unspecified hearing loss type: - Per patient request referral to ENT for further evaluation and management.  - Ambulatory referral to ENT   Follow Up Instructions: Return for annual physical exam.   Patient was given clear instructions to go to Emergency Department or return to medical center if symptoms don't improve, worsen, or new problems develop.The patient verbalized understanding.  I discussed the  assessment and treatment plan with the patient. The patient was provided an opportunity to ask questions and all were answered. The patient agreed with the plan and demonstrated an understanding of the instructions.   The patient was advised to call back or seek an in-person evaluation if the symptoms worsen or if the condition fails to improve as anticipated.   I provided 15 minutes total of non-face-to-face time during this encounter.   Rema Fendt, NP  [facility] [place], [place] [phone] 12/23/2020, 3:40 PM

## 2020-12-23 ENCOUNTER — Encounter: Payer: Self-pay | Admitting: Family

## 2020-12-23 ENCOUNTER — Telehealth (INDEPENDENT_AMBULATORY_CARE_PROVIDER_SITE_OTHER): Payer: Self-pay | Admitting: Family

## 2020-12-23 ENCOUNTER — Other Ambulatory Visit: Payer: Self-pay

## 2020-12-23 DIAGNOSIS — H9193 Unspecified hearing loss, bilateral: Secondary | ICD-10-CM

## 2020-12-23 DIAGNOSIS — M2142 Flat foot [pes planus] (acquired), left foot: Secondary | ICD-10-CM

## 2020-12-23 DIAGNOSIS — Z7689 Persons encountering health services in other specified circumstances: Secondary | ICD-10-CM

## 2020-12-23 DIAGNOSIS — F32A Depression, unspecified: Secondary | ICD-10-CM

## 2020-12-23 DIAGNOSIS — M2141 Flat foot [pes planus] (acquired), right foot: Secondary | ICD-10-CM

## 2020-12-23 DIAGNOSIS — F419 Anxiety disorder, unspecified: Secondary | ICD-10-CM

## 2020-12-23 MED ORDER — SERTRALINE HCL 25 MG PO TABS: 25.0000 mg | ORAL_TABLET | Freq: Every day | ORAL | 0 refills | Status: DC

## 2020-12-23 NOTE — Progress Notes (Signed)
Establish care Needs refill on Sertraline 50mg 

## 2021-01-16 NOTE — Progress Notes (Signed)
Patient did not show for appointment.   

## 2021-01-17 ENCOUNTER — Encounter: Payer: Self-pay | Admitting: Family

## 2021-02-06 NOTE — Progress Notes (Signed)
Patient did not show for appointment.   

## 2021-02-07 ENCOUNTER — Encounter: Payer: Self-pay | Admitting: Family

## 2021-04-15 ENCOUNTER — Ambulatory Visit (HOSPITAL_COMMUNITY)
Admission: EM | Admit: 2021-04-15 | Discharge: 2021-04-15 | Disposition: A | Discharge status: Home / Self Care | Payer: Managed Care, Other (non HMO) | Attending: Student | Admitting: Student

## 2021-04-15 ENCOUNTER — Other Ambulatory Visit: Payer: Self-pay

## 2021-04-15 ENCOUNTER — Encounter (HOSPITAL_COMMUNITY): Payer: Self-pay

## 2021-04-15 DIAGNOSIS — J069 Acute upper respiratory infection, unspecified: Secondary | ICD-10-CM | POA: Diagnosis present

## 2021-04-15 DIAGNOSIS — Z1152 Encounter for screening for COVID-19: Secondary | ICD-10-CM | POA: Diagnosis not present

## 2021-04-15 LAB — SARS CORONAVIRUS 2 (TAT 6-24 HRS): SARS Coronavirus 2: POSITIVE — AB

## 2021-04-15 MED ORDER — BENZONATATE 100 MG PO CAPS: 100.0000 mg | ORAL_CAPSULE | Freq: Three times a day (TID) | ORAL | 0 refills | Status: DC

## 2021-04-15 MED ORDER — PROMETHAZINE-DM 6.25-15 MG/5ML PO SYRP: 5.0000 mL | ORAL_SOLUTION | Freq: Four times a day (QID) | ORAL | 0 refills | Status: DC | PRN

## 2021-04-15 NOTE — Discharge Instructions (Addendum)
-  Promethazine DM cough syrup for congestion/cough. This could make you drowsy, so take at night before bed. -Tessalon (Benzonatate) as needed for cough. Take one pill up to 3x daily (every 8 hours) -You can use over-the-counter medications like Mucinex, DayQuil for additional relief. -For fever/chills, body aches, headaches -You can take Tylenol up to 1000 mg 3 times daily, and ibuprofen up to 800 mg 3 times daily with food.  You can take these together, or alternate every 3-4 hours. -With a virus, you're typically contagious for 5-7 days, or as long as you're having fevers. Isolate for 5 days if covid positive.

## 2021-04-15 NOTE — ED Provider Notes (Signed)
[facility]    CSN: [id number] Arrival date & time: 04/15/21  0841      History   Chief Complaint Chief Complaint  Patient presents with   Sore Throat    HPI Gary French is a 30 y.o. male presenting with sore throat, cough, congestion, fatigue.  Medical history noncontributory, denies history of cardiopulmonary disease.  States he thinks he has COVID.  Has not tried over-the-counter medications for relief. Denies fevers/chills, n/v/d, shortness of breath, chest pain, facial pain, teeth pain, headaches, loss of taste/smell, swollen lymph nodes, ear pain.    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 Authorizing Provider  benzonatate (TESSALON) 100 MG capsule Take 1 capsule (100 mg total) by mouth every 8 (eight) hours. 04/15/21  Yes Rhys Martini, PA-C  promethazine-dextromethorphan (PROMETHAZINE-DM) 6.25-15 MG/5ML syrup Take 5 mLs by mouth 4 (four) times daily as needed for cough. 04/15/21  Yes Rhys Martini, PA-C  albuterol (PROVENTIL HFA;VENTOLIN HFA) 108 (90 Base) MCG/ACT inhaler Inhale 1-2 puffs into the lungs every 6 (six) hours as needed for wheezing or shortness of breath. 11/21/17   Linus Mako B, NP  sertraline (ZOLOFT) 25 MG tablet Take 1 tablet (25 mg total) by mouth daily. 12/23/20   Rema Fendt, NP    Family History History reviewed. No pertinent family history.  Social History Social History   Tobacco Use   Smoking status: Every Day    Packs/day: 0.25    Years: 15.00    Pack years: 3.75    Types: Cigarettes   Smokeless tobacco: Never  Vaping Use   Vaping Use: Never used  Substance Use Topics   Alcohol use: Yes   Drug use: No      Allergies   Patient has no known allergies.   Review of Systems Review of Systems  Constitutional:  Negative for appetite change, chills, diaphoresis, fever and unexpected weight change.  HENT:  Positive for congestion and sore throat. Negative for ear pain, sinus pressure, sinus pain, sneezing and trouble swallowing.   Respiratory:  Positive for cough. Negative for chest tightness and shortness of breath.   Cardiovascular:  Negative for chest pain.  Gastrointestinal:  Negative for abdominal distention, abdominal pain, anal bleeding, blood in stool, constipation, diarrhea, nausea, rectal pain and vomiting.  Genitourinary:  Negative for dysuria, flank pain, frequency and urgency.  Musculoskeletal:  Negative for back pain and myalgias.  Neurological:  Negative for dizziness, light-headedness and headaches.    Physical Exam Triage Vital Signs ED Triage Vitals  Enc Vitals Group     BP 04/15/21 0941 123/84     Pulse Rate 04/15/21 0941 (!) 115     Resp 04/15/21 0941 (!) 21     Temp 04/15/21 0941 99.5 F (37.5 C)     Temp Source 04/15/21 0941 Oral     SpO2 04/15/21 0941 97 %     Weight --      Height --      Head Circumference --      Peak Flow --      Pain Score 04/15/21 0940 0  Pain Loc --      Pain Edu? --      Excl. in GC? --    No data found.  Updated Vital Signs BP 123/84 (BP Location: Right Arm)   Pulse (!) 115   Temp 99.5 F (37.5 C) (Oral)   Resp (!) 21   SpO2 97%   Visual Acuity Right Eye Distance:   Left Eye Distance:   Bilateral Distance:    Right Eye Near:   Left Eye Near:    Bilateral Near:     Physical Exam Vitals reviewed.  Constitutional:      General: He is not in acute distress.    Appearance: Normal appearance. He is not ill-appearing.  HENT:     Head: Normocephalic and atraumatic.     Right Ear: Tympanic membrane, ear canal and external ear normal. No tenderness. No middle ear effusion. There is no impacted cerumen. Tympanic  membrane is not perforated, erythematous, retracted or bulging.     Left Ear: Tympanic membrane, ear canal and external ear normal. No tenderness.  No middle ear effusion. There is no impacted cerumen. Tympanic membrane is not perforated, erythematous, retracted or bulging.     Nose: Nose normal. No congestion.     Mouth/Throat:     Mouth: Mucous membranes are moist.     Pharynx: Uvula midline. Posterior oropharyngeal erythema present. No oropharyngeal exudate.     Tonsils: No tonsillar exudate.     Comments: Smooth erythema posterior pharynx On exam, uvula is midline, she is tolerating her secretions without difficulty, there is no trismus, no drooling, she has normal phonation  Eyes:     Extraocular Movements: Extraocular movements intact.     Pupils: Pupils are equal, round, and reactive to light.  Cardiovascular:     Rate and Rhythm: Normal rate and regular rhythm.     Heart sounds: Normal heart sounds.  Pulmonary:     Effort: Pulmonary effort is normal.     Breath sounds: Normal breath sounds. No decreased breath sounds, wheezing, rhonchi or rales.     Comments: Occ cough Abdominal:     Palpations: Abdomen is soft.     Tenderness: There is no abdominal tenderness. There is no guarding or rebound.  Lymphadenopathy:     Cervical: No cervical adenopathy.     Right cervical: No superficial cervical adenopathy.    Left cervical: No superficial cervical adenopathy.  Neurological:     General: No focal deficit present.     Mental Status: He is alert and oriented to person, place, and time.  Psychiatric:        Mood and Affect: Mood normal.        Behavior: Behavior normal.        Thought Content: Thought content normal.        Judgment: Judgment normal.     UC Treatments / Results  Labs (all labs ordered are listed, but only abnormal results are displayed) Labs Reviewed  SARS CORONAVIRUS 2 (TAT 6-24 HRS)    EKG   Radiology No results found.  Procedures Procedures  (including critical care time)  Medications Ordered in UC Medications - No data to display  Initial Impression / Assessment and Plan / UC Course  I have reviewed the triage vital signs and the nursing notes.  Pertinent labs & imaging results that were available during my care of the patient were reviewed by me and considered in my medical decision making (see chart for details).  This patient is a very pleasant 30 y.o. year old male presenting with viral URI. Today this pt is febrile and tachycardic and borderline tachypneic, oxygenating well on room air, no wheezes rhonchi or rales. No history pulm ds.   Covid PCR sent.  Centor score 0, rapid strep deferred.  Promethazine, Tessalon.  ED return precautions discussed. Patient verbalizes understanding and agreement.    Work note provided. Coding Level 4 for acute illness with systemic symptoms, and prescription drug management  Final Clinical Impressions(s) / UC Diagnoses   Final diagnoses:  Viral URI with cough  Encounter for screening for COVID-19     Discharge Instructions      -Promethazine DM cough syrup for congestion/cough. This could make you drowsy, so take at night before bed. -Tessalon (Benzonatate) as needed for cough. Take one pill up to 3x daily (every 8 hours) -You can use over-the-counter medications like Mucinex, DayQuil for additional relief. -For fever/chills, body aches, headaches -You can take Tylenol up to 1000 mg 3 times daily, and ibuprofen up to 800 mg 3 times daily with food.  You can take these together, or alternate every 3-4 hours. -With a virus, you're typically contagious for 5-7 days, or as long as you're having fevers. Isolate for 5 days if covid positive.     ED Prescriptions     Medication Sig Dispense Auth. Provider   promethazine-dextromethorphan (PROMETHAZINE-DM) 6.25-15 MG/5ML syrup Take 5 mLs by mouth 4 (four) times daily as needed for cough. 118 mL Rhys Martini, PA-C    benzonatate (TESSALON) 100 MG capsule Take 1 capsule (100 mg total) by mouth every 8 (eight) hours. 21 capsule Rhys Martini, PA-C      PDMP not reviewed this encounter.   Rhys Martini, PA-C 04/15/21 1018

## 2021-04-15 NOTE — ED Triage Notes (Signed)
Pt in with c/o st that started last night  Pt has not had otc meds for sx

## 2021-04-24 NOTE — Progress Notes (Signed)
  Patient ID: Gary French, male    DOB: 07/31/1991  MRN: [id number]  CC: Annual Physical Exam  Subjective: Gary French is a 30 y.o. male who presents for annual physical exam.  His concerns today include:   Requesting to resume Sertraline. Reports since last visit was unable to pickup Sertraline from the pharmacy. Denies thoughts of self-harm, suicidal ideations, and homicidal ideations. Requesting referrals back to foot doctor and ear doctor reporting he has been unable to scheduled since last visit.   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 1 Inhaler 0   benzonatate (TESSALON) 100 MG capsule Take 1 capsule (100 mg total) by mouth every 8 (eight) hours. 21 capsule 0   promethazine-dextromethorphan (PROMETHAZINE-DM) 6.25-15 MG/5ML syrup Take 5 mLs by mouth 4 (four) times daily as needed for cough. 118 mL 0   No current facility-administered medications on file prior to visit.    No Known Allergies  Social History   Socioeconomic History   Marital status: Unknown    Spouse name: Not on file   Number of children: Not on file   Years of education: Not on file   Highest education level: Not on file  Occupational History   Not on file  Tobacco Use   Smoking  status: Every Day    Packs/day: 0.25    Years: 15.00    Pack years: 3.75    Types: Cigarettes   Smokeless tobacco: Never  Vaping Use   Vaping Use: Never used  Substance and Sexual Activity   Alcohol use: Yes   Drug use: No   Sexual activity: Not on file  Other Topics Concern   Not on file  Social History Narrative   Not on file   Social Determinants of Health   Financial Resource Strain: Not on file  Food Insecurity: Not on file  Transportation Needs: Not on file  Physical Activity: Not on file  Stress: Not on file  Social Connections: Not on file  Intimate Partner Violence: Not on file    No family history on file.  Past Surgical History:  Procedure Laterality Date   NO PAST SURGERIES      ROS: Review of Systems Negative except as stated above  PHYSICAL EXAM: Temp 98.1 F (36.7 C)   Resp 16   Ht 5' 6.93" (1.7 m)   Wt (!) 322 lb 3.2 oz (146.1 kg)   BMI 50.57 kg/m   Physical Exam HENT:     Head: Normocephalic and atraumatic.  Eyes:     Extraocular Movements: Extraocular movements intact.     Conjunctiva/sclera: Conjunctivae normal.     Pupils: Pupils are equal, round, and reactive to light.    Cardiovascular:     Rate and Rhythm: Normal rate and regular rhythm.     Pulses: Normal pulses.     Heart sounds: Normal heart sounds.  Pulmonary:     Effort: Pulmonary effort is normal.     Breath sounds: Normal breath sounds.  Abdominal:     General: Bowel sounds are normal.  Genitourinary:    Comments: Patient declined exam. Musculoskeletal:        General: Normal range of motion.     Cervical back: Normal range of motion and neck supple.  Skin:    General: Skin is warm and dry.     Capillary Refill: Capillary refill takes less than 2 seconds.  Neurological:     General: No focal deficit present.     Mental Status: He is alert and oriented to person, place, and time.  Psychiatric:        Mood and Affect: Mood normal.        Behavior: Behavior normal.    ASSESSMENT AND PLAN: 1. Annual physical exam: - Counseled on 150 minutes of exercise per week as tolerated, healthy eating (including decreased daily intake of saturated fats, cholesterol, added sugars, sodium), STI prevention, and routine healthcare maintenance.  2. Screening for metabolic disorder: - CMP14+EGFR to check kidney function, liver function, and electrolyte balance.  - CMP14+EGFR  3. Screening for deficiency anemia: - CBC to screen for anemia. - CBC  4. Diabetes mellitus screening: - Hemoglobin A1c to screen for pre-diabetes/diabetes. - Hemoglobin A1c  5. Screening cholesterol level: - Lipid panel to screen for high cholesterol.  - Lipid panel  6. Thyroid disorder screen: - TSH to check thyroid function.  - TSH  7. Need for hepatitis C screening test: - Hepatitis C antibody to screen for hepatitis C.  - Hepatitis C Antibody  8. Encounter for screening for HIV: - HIV antibody to screen for human immunodeficiency virus.  - HIV antibody (with reflex)  9. Anxiety and depression: - Patient denies thoughts of self-harm, suicidal ideations, and homicidal ideations.  - Resume Sertraline as prescribed.  - Follow-up wit primary provider in 4 weeks or sooner if needed.  - sertraline (ZOLOFT) 25 MG tablet; Take 1 tablet (25 mg total) by mouth daily.  Dispense: 30 tablet; Refill: 0  10. Flat feet, bilateral: - Referral to Podiatry for further evaluation and management.  - Ambulatory referral to Podiatry  11. Bilateral hearing loss, unspecified hearing loss type: - Referral to Audiology for further evaluation and management.  - Ambulatory referral to Audiology    Patient was given the opportunity to ask questions.  Patient verbalized understanding of the plan and was able to repeat key elements of the plan. Patient was given clear instructions to go to Emergency Department or return to medical center if symptoms don't improve, worsen, or new problems develop.The  patient verbalized understanding.   Orders Placed This Encounter  Procedures   HIV antibody (with reflex)   Hepatitis C Antibody   CBC   Lipid panel   TSH   CMP14+EGFR   Hemoglobin A1c   Ambulatory referral to Podiatry   Ambulatory referral to Audiology     Requested Prescriptions   Signed Prescriptions Disp Refills   sertraline (ZOLOFT) 25 MG tablet 30 tablet 0    Sig: Take 1 tablet (25 mg total) by mouth daily.    Return in about 1 year (around 04/28/2022) for Physical per patient preference and anxiety/depression 4 weeks .  Amy J Stephens, NP   

## 2021-04-28 ENCOUNTER — Encounter: Payer: Self-pay | Admitting: Family

## 2021-04-28 ENCOUNTER — Ambulatory Visit (INDEPENDENT_AMBULATORY_CARE_PROVIDER_SITE_OTHER): Payer: Managed Care, Other (non HMO) | Admitting: Family

## 2021-04-28 ENCOUNTER — Other Ambulatory Visit: Payer: Self-pay

## 2021-04-28 VITALS — Temp 98.1°F | Resp 16 | Ht 66.93 in | Wt 322.2 lb

## 2021-04-28 DIAGNOSIS — F32A Depression, unspecified: Secondary | ICD-10-CM | POA: Diagnosis not present

## 2021-04-28 DIAGNOSIS — Z13220 Encounter for screening for lipoid disorders: Secondary | ICD-10-CM

## 2021-04-28 DIAGNOSIS — F419 Anxiety disorder, unspecified: Secondary | ICD-10-CM

## 2021-04-28 DIAGNOSIS — H9193 Unspecified hearing loss, bilateral: Secondary | ICD-10-CM

## 2021-04-28 DIAGNOSIS — Z114 Encounter for screening for human immunodeficiency virus [HIV]: Secondary | ICD-10-CM

## 2021-04-28 DIAGNOSIS — M2141 Flat foot [pes planus] (acquired), right foot: Secondary | ICD-10-CM

## 2021-04-28 DIAGNOSIS — Z130 Encounter for screening for diseases of the blood and blood-forming organs and certain disorders involving the immune mechanism: Secondary | ICD-10-CM

## 2021-04-28 DIAGNOSIS — M2142 Flat foot [pes planus] (acquired), left foot: Secondary | ICD-10-CM

## 2021-04-28 DIAGNOSIS — Z1159 Encounter for screening for other viral diseases: Secondary | ICD-10-CM

## 2021-04-28 DIAGNOSIS — Z0000 Encounter for general adult medical examination without abnormal findings: Secondary | ICD-10-CM

## 2021-04-28 DIAGNOSIS — Z1329 Encounter for screening for other suspected endocrine disorder: Secondary | ICD-10-CM

## 2021-04-28 DIAGNOSIS — Z131 Encounter for screening for diabetes mellitus: Secondary | ICD-10-CM

## 2021-04-28 DIAGNOSIS — Z13228 Encounter for screening for other metabolic disorders: Secondary | ICD-10-CM

## 2021-04-28 MED ORDER — SERTRALINE HCL 25 MG PO TABS: 25.0000 mg | ORAL_TABLET | Freq: Every day | ORAL | 0 refills | Status: DC

## 2021-04-28 NOTE — Progress Notes (Signed)
Pt presents for annual physical exam, pt wants to start back on Zoloft Wants referral to Podiatrist for flat feet  Needs referral for hearing aid

## 2021-04-29 ENCOUNTER — Encounter: Payer: Self-pay | Admitting: Family

## 2021-04-29 DIAGNOSIS — R7303 Prediabetes: Secondary | ICD-10-CM | POA: Insufficient documentation

## 2021-04-29 LAB — CBC
Hematocrit: 47.6 % (ref 37.5–51.0)
Hemoglobin: 15.8 g/dL (ref 13.0–17.7)
MCH: 28.2 pg (ref 26.6–33.0)
MCHC: 33.2 g/dL (ref 31.5–35.7)
MCV: 85 fL (ref 79–97)
Platelets: 312 10*3/uL (ref 150–450)
RBC: 5.6 x10E6/uL (ref 4.14–5.80)
RDW: 12.7 % (ref 11.6–15.4)
WBC: 10.7 10*3/uL (ref 3.4–10.8)

## 2021-04-29 LAB — CMP14+EGFR
ALT: 27 IU/L (ref 0–44)
AST: 17 IU/L (ref 0–40)
Albumin/Globulin Ratio: 1.7 (ref 1.2–2.2)
Albumin: 4.8 g/dL (ref 4.0–5.0)
Alkaline Phosphatase: 71 IU/L (ref 44–121)
BUN/Creatinine Ratio: 14 (ref 9–20)
BUN: 12 mg/dL (ref 6–20)
Bilirubin Total: 0.4 mg/dL (ref 0.0–1.2)
CO2: 25 mmol/L (ref 20–29)
Calcium: 9.9 mg/dL (ref 8.7–10.2)
Chloride: 101 mmol/L (ref 96–106)
Creatinine, Ser: 0.88 mg/dL (ref 0.76–1.27)
Globulin, Total: 2.8 g/dL (ref 1.5–4.5)
Glucose: 89 mg/dL (ref 70–99)
Potassium: 4.6 mmol/L (ref 3.5–5.2)
Sodium: 141 mmol/L (ref 134–144)
Total Protein: 7.6 g/dL (ref 6.0–8.5)
eGFR: 118 mL/min/{1.73_m2} (ref >59)

## 2021-04-29 LAB — HEMOGLOBIN A1C
Est. average glucose Bld gHb Est-mCnc: 131 mg/dL
Hgb A1c MFr Bld: 6.2 % — ABNORMAL HIGH (ref 4.8–5.6)

## 2021-04-29 LAB — LIPID PANEL
Chol/HDL Ratio: 3.5 ratio (ref 0.0–5.0)
Cholesterol, Total: 166 mg/dL (ref 100–199)
HDL: 47 mg/dL (ref >39)
LDL Chol Calc (NIH): 102 mg/dL — ABNORMAL HIGH (ref 0–99)
Triglycerides: 93 mg/dL (ref 0–149)
VLDL Cholesterol Cal: 17 mg/dL (ref 5–40)

## 2021-04-29 LAB — TSH: TSH: 2.41 u[IU]/mL (ref 0.450–4.500)

## 2021-04-29 LAB — HIV ANTIBODY (ROUTINE TESTING W REFLEX): HIV Screen 4th Generation wRfx: NONREACTIVE

## 2021-04-29 LAB — HEPATITIS C ANTIBODY: Hep C Virus Ab: <0.1 s/co ratio (ref 0.0–0.9)

## 2021-04-29 NOTE — Progress Notes (Signed)
Kidney function normal.   Liver function normal.   Thyroid function normal.   No anemia.   Hepatitis C negative.   HIV negative.   Cholesterol mildly higher than expected. High cholesterol may increase risk of heart attack and/or stroke. Consider eating more fruits, vegetables, and lean baked meats such as chicken or fish. Moderate intensity exercise at least 150 minutes as tolerated per week may help as well. However, your risk of heart attack/stroke in ten years is less than average risk so does not need to start a medication at the moment. Encouraged to recheck cholesterol in 3 to 6 months.   Hemoglobin A1c is consistent with pre-diabetes. Practice healthy eating habits of fresh fruit and vegetables, lean baked meats such as chicken, fish, and [place]; limit breads, rice, pastas, and desserts; practice regular aerobic exercise (at least 150 minutes a week as tolerated). No medication needed at the moment. Encouraged to recheck in 3 to 6 months.

## 2021-05-25 NOTE — Progress Notes (Signed)
Erroneous encounter

## 2021-05-28 ENCOUNTER — Encounter: Payer: Managed Care, Other (non HMO) | Admitting: Family

## 2021-05-28 DIAGNOSIS — F32A Depression, unspecified: Secondary | ICD-10-CM

## 2021-12-01 ENCOUNTER — Ambulatory Visit (HOSPITAL_COMMUNITY)
Admission: EM | Admit: 2021-12-01 | Discharge: 2021-12-01 | Disposition: A | Discharge status: Home / Self Care | Payer: Managed Care, Other (non HMO) | Attending: Family Medicine | Admitting: Family Medicine

## 2021-12-01 ENCOUNTER — Encounter (HOSPITAL_COMMUNITY): Payer: Self-pay

## 2021-12-01 DIAGNOSIS — R0781 Pleurodynia: Secondary | ICD-10-CM

## 2021-12-01 MED ORDER — IBUPROFEN 600 MG PO TABS: 600.0000 mg | ORAL_TABLET | Freq: Four times a day (QID) | ORAL | 0 refills | Status: DC | PRN

## 2021-12-01 MED ORDER — TIZANIDINE HCL 4 MG PO TABS: 4.0000 mg | ORAL_TABLET | Freq: Four times a day (QID) | ORAL | 0 refills | Status: DC | PRN

## 2021-12-01 NOTE — ED Triage Notes (Signed)
Pt present back pain mid-section of his back. Symptoms started two days ago. Pt state that he was at work and cannot take the pain any much longer.  ?

## 2021-12-01 NOTE — ED Provider Notes (Signed)
?[facility] ? ? ? ?CSN: [id number] ?Arrival date & time: 12/01/21  1159 ? ? ?  ? ?History   ?Chief Complaint ?Chief Complaint  ?Patient presents with  ? Back Pain  ? ? ?HPI ?Gary French is a 31 y.o. male.  ? ?Patient is here for back pain started 2 days ago.  Left mid back.  The pain was coming and going but now constant.  ?He has not taken anything, just using topical cream, icy hot.  ?No fevers, chills or cough.  ?No urinary problem or Bms.  ?He works in a [employer], does a lot of heavy lifting.  ?He has had pain before, but not this bad.  ? ?Past Medical History:  ?Diagnosis Date  ? Anxiety   ? ? ?Patient Active Problem List  ? Diagnosis Date Noted  ? Prediabetes 04/29/2021  ? 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 ? ?Past Surgical History:  ?Procedure Laterality Date  ? NO PAST SURGERIES    ? ? ? ? ? ?Home Medications   ? ?Prior to Admission medications   ?Medication Sig Start Date End Date Taking? Authorizing Provider  ?albuterol (PROVENTIL HFA;VENTOLIN HFA) 108 (90 Base) MCG/ACT inhaler Inhale 1-2 puffs into the lungs every 6 (six) hours as needed for wheezing or shortness of breath. 11/21/17   Georgetta Haber, NP  ?benzonatate (TESSALON) 100 MG capsule Take 1 capsule (100 mg total) by mouth every 8 (eight) hours. 04/15/21   Rhys Martini, PA-C  ?promethazine-dextromethorphan (PROMETHAZINE-DM) 6.25-15 MG/5ML syrup Take 5 mLs by mouth 4 (four) times daily as needed for cough. 04/15/21   Rhys Martini, PA-C  ?sertraline (ZOLOFT) 25 MG tablet Take 1 tablet (25 mg total) by mouth daily. 04/28/21   Rema Fendt, NP  ? ? ?Family History ?History reviewed. No pertinent family history. ? ?Social History ?Social History  ? ?Tobacco Use  ? Smoking status: Every Day  ?  Packs/day: 0.25  ?  Years: 15.00  ?  Pack years: 3.75  ?  Types: Cigarettes  ? Smokeless tobacco: Never  ?Vaping Use  ? Vaping Use: Never used  ?Substance Use Topics  ? Alcohol use: Yes  ? Drug use: No  ? ? ? ?Allergies   ?Patient has  no known allergies. ? ? ?Review of Systems ?Review of Systems  ?Constitutional: Negative.   ?HENT: Negative.    ?Respiratory: Negative.    ?Cardiovascular: Negative.   ?Gastrointestinal: Negative.   ?Musculoskeletal:  Positive for back pain.  ? ? ?Physical Exam ?Triage Vital Signs ?ED Triage Vitals  ?Enc Vitals Group  ?   BP 12/01/21 1357 135/84  ?   Pulse Rate 12/01/21 1357 (!) 105  ?   Resp 12/01/21 1357 18  ?   Temp 12/01/21 1357 98.4 ?F (36.9 ?C)  ?   Temp Source 12/01/21 1357 Oral  ?   SpO2 12/01/21 1357 96 %  ?   Weight --   ?   Height --   ?   Head Circumference --   ?   Peak Flow --   ?   Pain Score 12/01/21 1358 8  ?   Pain Loc --   ?   Pain Edu? --   ?   Excl. in GC? --   ? ?No data found. ? ?Updated Vital Signs ?BP 135/84 (BP Location: Left Arm)   Pulse (!) 105   Temp 98.4 ?F (36.9 ?C) (Oral)   Resp 18   SpO2 96%  ? ?  Visual Acuity ?Right Eye Distance:   ?Left Eye Distance:   ?Bilateral Distance:   ? ?Right Eye Near:   ?Left Eye Near:    ?Bilateral Near:    ? ?Physical Exam ?Constitutional:   ?   Appearance: Normal appearance.  ?Cardiovascular:  ?   Rate and Rhythm: Normal rate and regular rhythm.  ?Pulmonary:  ?   Effort: Pulmonary effort is normal.  ?   Breath sounds: Normal breath sounds.  ?Abdominal:  ?   Palpations: Abdomen is soft.  ?   Tenderness: There is no abdominal tenderness.  ?Musculoskeletal:  ?   Comments: TTP to the left lateral rib area, under the left axilla;  no spinous tenderness or other TTP noted  ?Neurological:  ?   General: No focal deficit present.  ?   Mental Status: He is alert.  ?Psychiatric:     ?   Mood and Affect: Mood normal.  ? ? ? ?UC Treatments / Results  ?Labs ?(all labs ordered are listed, but only abnormal results are displayed) ?Labs Reviewed - No data to display ? ?EKG ? ? ?Radiology ?No results found. ? ?Procedures ?Procedures (including critical care time) ? ?Medications Ordered in UC ?Medications - No data to display ? ?Initial Impression / Assessment and Plan  / UC Course  ?I have reviewed the triage vital signs and the nursing notes. ? ?Pertinent labs & imaging results that were available during my care of the patient were reviewed by me and considered in my medical decision making (see chart for details). ? ?  ?Final Clinical Impressions(s) / UC Diagnoses  ? ?Final diagnoses:  ?Rib pain on left side  ? ? ? ?Discharge Instructions   ? ?  ?You were seen today for pain at the left back and ribs.  This is likely muscular in nature.  ?I have sent out ibuprofen to take for your pain, in addition to a muscle relaxer.  The muscle relaxer could make you tired/sleepy so please take when home and not driving.  ?I also recommend using a heating pad or ice pack to the area.  ?I recommend rest as well.  ?Follow up if not improving as anticipated.  ? ? ? ?ED Prescriptions   ? ? Medication Sig Dispense Auth. Provider  ? ibuprofen (ADVIL) 600 MG tablet Take 1 tablet (600 mg total) by mouth every 6 (six) hours as needed. 30 tablet Coulter Oldaker, MD  ? tiZANidine (ZANAFLEX) 4 MG tablet Take 1 tablet (4 mg total) by mouth every 6 (six) hours as needed for muscle spasms. 30 tablet Jannifer Franklin, MD  ? ?  ? ?PDMP not reviewed this encounter. ?  Jannifer Franklin, MD ?12/01/21 1412 ? ?

## 2021-12-01 NOTE — Discharge Instructions (Signed)
You were seen today for pain at the left back and ribs.  This is likely muscular in nature.  ?I have sent out ibuprofen to take for your pain, in addition to a muscle relaxer.  The muscle relaxer could make you tired/sleepy so please take when home and not driving.  ?I also recommend using a heating pad or ice pack to the area.  ?I recommend rest as well.  ?Follow up if not improving as anticipated.  ?

## 2022-12-17 NOTE — Progress Notes (Unsigned)
Patient ID: Gary French, male    DOB: 1990/08/09  MRN: [id number]  CC: Referrals/Labs  Subjective: Gary French is a 32 y.o. male who presents for referral/labs. He is accompanied by his sister Gary French who serves as primary historian.  His concerns today include:  - Patient's sister reports patient would like a referral to Medical Weight Management. Patient is not ready to begin weight loss medication today at [facility]. He reports he is trying to monitor what he eats. He reports he usually eats one meal at the end of the day after work. Patient's sister reports patient could use improvement with monitoring salt and energy drinks intake. He does not exercise outside of normal routine. He did not specify how much weight he would like to lose. - Patient's sister states patient needs blood work on today.  - He reports bilateral feet swelling. 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 He denies associated red flag symptoms and recent trauma/injury. He reports history of foot surgery several years ago. Patient's sister reports she thinks patient need orthotics for shoes. Patient's sister would like for patient to be referred to Podiatry. - Patient's sister reports patient snores and easily falls asleep during the day. Patient's sister would like for patient to have a sleep study. - No further issues/concerns for discussion today.   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 (Patient not taking: Reported on 12/22/2022) 30 tablet 0   sertraline (ZOLOFT) 25 MG tablet Take 1 tablet (25 mg total) by mouth daily.  (Patient not taking: Reported on 12/22/2022) 30 tablet 0   tiZANidine (ZANAFLEX) 4 MG tablet Take 1 tablet (4 mg total) by mouth every 6 (six) hours as needed for muscle spasms. (Patient not taking: Reported on 12/22/2022) 30 tablet 0   No current facility-administered medications on file prior to visit.    No Known Allergies  Social History   Socioeconomic History   Marital status: Unknown    Spouse name: Not on file   Number of children: Not on file   Years of education: Not on file   Highest education level: Not on file  Occupational History   Not on file  Tobacco Use   Smoking status: Every Day    Packs/day: 0.25    Years: 15.00    Additional pack years: 0.00    Total pack years: 3.75    Types: Cigarettes   Smokeless tobacco: Never  Vaping Use   Vaping Use: Never used  Substance and Sexual Activity   Alcohol use: Yes   Drug use: No   Sexual activity: Not on file  Other Topics Concern   Not on file  Social History Narrative   Not on file   Social Determinants of Health   Financial Resource Strain: Not on file  Food Insecurity: Not on file  Transportation Needs: Not on file  Physical Activity: Not on file  Stress: Not on file  Social Connections: Not on file  Intimate Partner Violence: Not on file    No family history on file.  Past Surgical History:  Procedure Laterality Date  NO PAST SURGERIES      ROS: Review of Systems Negative except as stated above  PHYSICAL EXAM: BP 127/84   Pulse (!) 108   Temp 98.6 F (37 C) (Oral)   Resp (!) 24   Ht 5\' 6"  (1.676 m)   Wt (!) 338 lb (153.3 kg)   SpO2 93%   BMI 54.55 kg/m   Physical Exam HENT:     Head: Normocephalic and atraumatic.  Eyes:     Extraocular Movements: Extraocular movements intact.     Conjunctiva/sclera: Conjunctivae normal.     Pupils: Pupils are equal, round, and reactive to light.  Cardiovascular:     Rate and Rhythm: Tachycardia present.     Pulses: Normal pulses.     Heart  sounds: Normal heart sounds.  Pulmonary:     Effort: Pulmonary effort is normal.     Breath sounds: Normal breath sounds.  Musculoskeletal:     Right shoulder: Normal.     Left shoulder: Normal.     Right upper arm: Normal.     Left upper arm: Normal.     Right elbow: Normal.     Left elbow: Normal.     Right forearm: Normal.     Left forearm: Normal.     Right wrist: Normal.     Left wrist: Normal.     Right hand: Normal.     Left hand: Normal.     Cervical back: Normal, normal range of motion and neck supple.     Thoracic back: Normal.     Lumbar back: Normal.     Right hip: Normal.     Left hip: Normal.     Right upper leg: Normal.     Left upper leg: Normal.     Right knee: Normal.     Left knee: Normal.     Right lower leg: Normal.     Left lower leg: Normal.     Right ankle: Normal.     Left ankle: Normal.     Right foot: Swelling present.     Left foot: Swelling present.  Neurological:     General: No focal deficit present.     Mental Status: He is alert and oriented to person, place, and time.  Psychiatric:        Mood and Affect: Mood normal.        Behavior: Behavior normal.     ASSESSMENT AND PLAN: Note - On 12/23/2022 I again discussed with [name], RN patient was to have additional labs collected in addition to lipid panel on 12/22/2022. I sent patient a result note on 12/23/2022 at 0753 from lipid panel and made patient aware to return to our office for lab only appointment to have remaining labs collected.  1. Encounter for weight management 2. BMI 50.0-59.9, adult (HCC) 3. Weight gain - Patient declined pharmacological therapy.  - Referral to Medical Weight Management for further evaluation/management. During the interim follow-up with primary provider as scheduled. - Amb Ref to Medical Weight Management  4. Screening for metabolic disorder - Routine screening.  - CMP14+EGFR; Future  5. Screening for deficiency anemia - Routine screening.   - CBC; Future  6. History of prediabetes 7. Diabetes mellitus screening - Routine screening.  - Hemoglobin A1c; Future  8. Screening cholesterol level - Routine screening.  - Lipid panel  9. Thyroid disorder screen - Routine screening.  - TSH; Future  10. Snoring - Referral to Sleep Study for further evaluation/management. -  PSG Sleep Study; Future  11. History of foot surgery 12. Flat feet, bilateral 13. Edema of foot - I discussed with patient and patient's sister in detail conservative measures such as but not limited to monitoring salt intake, use of over-the-counter compression stockings, and elevation of bilateral lower extremities when able to do so. Patient and patient's sister verbalized understanding/agreement. - Referral to Podiatry for further evaluation/management. During the interim follow-up with primary provider as scheduled. - Ambulatory referral to Podiatry   Patient was given the opportunity to ask questions.  Patient verbalized understanding of the plan and was able to repeat key elements of the plan. Patient was given clear instructions to go to Emergency Department or return to medical center if symptoms don't improve, worsen, or new problems develop.The patient verbalized understanding.   Orders Placed This Encounter  Procedures   Lipid panel   CBC   TSH   CMP14+EGFR   Hemoglobin A1c   Amb Ref to Medical Weight Management   Ambulatory referral to Podiatry   PSG Sleep Study    Follow-up with primary provider as scheduled.   Rema Fendt, NP

## 2022-12-22 ENCOUNTER — Ambulatory Visit (INDEPENDENT_AMBULATORY_CARE_PROVIDER_SITE_OTHER): Payer: Managed Care, Other (non HMO) | Admitting: Family

## 2022-12-22 ENCOUNTER — Encounter: Payer: Self-pay | Admitting: Family

## 2022-12-22 VITALS — BP 127/84 | HR 108 | Temp 98.6°F | Resp 24 | Ht 66.0 in | Wt 338.0 lb

## 2022-12-22 DIAGNOSIS — Z9889 Other specified postprocedural states: Secondary | ICD-10-CM

## 2022-12-22 DIAGNOSIS — R600 Localized edema: Secondary | ICD-10-CM

## 2022-12-22 DIAGNOSIS — Z1329 Encounter for screening for other suspected endocrine disorder: Secondary | ICD-10-CM

## 2022-12-22 DIAGNOSIS — Z6841 Body Mass Index (BMI) 40.0 and over, adult: Secondary | ICD-10-CM

## 2022-12-22 DIAGNOSIS — Z7689 Persons encountering health services in other specified circumstances: Secondary | ICD-10-CM

## 2022-12-22 DIAGNOSIS — M2141 Flat foot [pes planus] (acquired), right foot: Secondary | ICD-10-CM | POA: Diagnosis not present

## 2022-12-22 DIAGNOSIS — Z13220 Encounter for screening for lipoid disorders: Secondary | ICD-10-CM | POA: Diagnosis not present

## 2022-12-22 DIAGNOSIS — Z131 Encounter for screening for diabetes mellitus: Secondary | ICD-10-CM

## 2022-12-22 DIAGNOSIS — Z13228 Encounter for screening for other metabolic disorders: Secondary | ICD-10-CM

## 2022-12-22 DIAGNOSIS — R0683 Snoring: Secondary | ICD-10-CM

## 2022-12-22 DIAGNOSIS — M2142 Flat foot [pes planus] (acquired), left foot: Secondary | ICD-10-CM | POA: Diagnosis not present

## 2022-12-22 DIAGNOSIS — R635 Abnormal weight gain: Secondary | ICD-10-CM

## 2022-12-22 DIAGNOSIS — Z87898 Personal history of other specified conditions: Secondary | ICD-10-CM

## 2022-12-22 DIAGNOSIS — Z130 Encounter for screening for diseases of the blood and blood-forming organs and certain disorders involving the immune mechanism: Secondary | ICD-10-CM

## 2022-12-22 DIAGNOSIS — R609 Edema, unspecified: Secondary | ICD-10-CM

## 2022-12-22 DIAGNOSIS — F17210 Nicotine dependence, cigarettes, uncomplicated: Secondary | ICD-10-CM

## 2022-12-23 LAB — LIPID PANEL
Chol/HDL Ratio: 3 ratio (ref 0.0–5.0)
Cholesterol, Total: 143 mg/dL (ref 100–199)
HDL: 48 mg/dL (ref >39)
LDL Chol Calc (NIH): 79 mg/dL (ref 0–99)
Triglycerides: 86 mg/dL (ref 0–149)
VLDL Cholesterol Cal: 16 mg/dL (ref 5–40)

## 2023-01-15 LAB — CMP14+EGFR
ALT: 36 IU/L (ref 0–44)
AST: 32 IU/L (ref 0–40)
Albumin/Globulin Ratio: 1.7 (ref 1.2–2.2)
Albumin: 4.7 g/dL (ref 4.1–5.1)
Alkaline Phosphatase: 85 IU/L (ref 44–121)
BUN/Creatinine Ratio: 12 (ref 9–20)
BUN: 13 mg/dL (ref 6–20)
Bilirubin Total: 0.6 mg/dL (ref 0.0–1.2)
CO2: 20 mmol/L (ref 20–29)
Calcium: 10 mg/dL (ref 8.7–10.2)
Chloride: 101 mmol/L (ref 96–106)
Creatinine, Ser: 1.06 mg/dL (ref 0.76–1.27)
Globulin, Total: 2.7 g/dL (ref 1.5–4.5)
Glucose: 92 mg/dL (ref 70–99)
Potassium: 4.5 mmol/L (ref 3.5–5.2)
Sodium: 142 mmol/L (ref 134–144)
Total Protein: 7.4 g/dL (ref 6.0–8.5)
eGFR: 95 mL/min/{1.73_m2} (ref >59)

## 2023-01-15 LAB — TSH: TSH: 2.59 u[IU]/mL (ref 0.450–4.500)

## 2023-01-15 LAB — SPECIMEN STATUS REPORT

## 2023-01-22 ENCOUNTER — Encounter: Payer: Self-pay | Admitting: *Deleted

## 2023-06-09 ENCOUNTER — Ambulatory Visit (INDEPENDENT_AMBULATORY_CARE_PROVIDER_SITE_OTHER): Payer: Managed Care, Other (non HMO) | Admitting: Family

## 2023-06-09 ENCOUNTER — Encounter: Payer: Self-pay | Admitting: Family

## 2023-06-09 VITALS — BP 121/83 | HR 119 | Temp 98.4°F | Ht 65.5 in | Wt 355.8 lb

## 2023-06-09 DIAGNOSIS — Z0289 Encounter for other administrative examinations: Secondary | ICD-10-CM | POA: Diagnosis not present

## 2023-06-09 DIAGNOSIS — M2142 Flat foot [pes planus] (acquired), left foot: Secondary | ICD-10-CM

## 2023-06-09 DIAGNOSIS — M2141 Flat foot [pes planus] (acquired), right foot: Secondary | ICD-10-CM

## 2023-06-09 DIAGNOSIS — M7989 Other specified soft tissue disorders: Secondary | ICD-10-CM

## 2023-06-09 NOTE — Progress Notes (Signed)
Patient states no other concerns to discuss except paper work.  Declined Flu vaccine

## 2023-06-09 NOTE — Progress Notes (Signed)
Patient ID: Gary French, male    DOB: 04/25/1991  MRN: [id number]  CC: FMLA Paperwork  Subjective: Gary French is a 32 y.o. male who presents for FMLA paperwork.   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 States he is currently scheduled for 12 to 13 hour days. States he is unable to work more than 10 hours daily due to persisting chronic swelling of bilateral feet. He denies red flag symptoms. Also, states he needs to be able to "call out of work without getting a point". Patient was referred to Podiatry in 2022 and states they "never called". No further issues/concerns for discussion today.   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   No current facility-administered medications on file prior to visit.    No Known Allergies  Social History   Socioeconomic History   Marital status: Unknown    Spouse name: Not on file   Number of children: Not on file   Years of education: Not on file   Highest education level: Not on file  Occupational History   Not on file  Tobacco Use   Smoking status: Every Day    Current packs/day: 0.25    Average packs/day: 0.3 packs/day for 15.0 years (3.8 ttl pk-yrs)    Types: Cigarettes   Smokeless tobacco: Never  Vaping Use   Vaping status: Never Used  Substance and Sexual Activity   Alcohol use: Yes   Drug use: No   Sexual activity: Not on file  Other Topics Concern   Not on file  Social History Narrative   Not on file   Social Determinants of Health   Financial Resource Strain: Not on file  Food Insecurity: Not on file  Transportation Needs: Not on file  Physical Activity: Not on file  Stress: Not on file  Social Connections: Not on file  Intimate Partner Violence: Not on file    No family history on file.  Past  Surgical History:  Procedure Laterality Date   NO PAST SURGERIES      ROS: Review of Systems Negative except as stated above  PHYSICAL EXAM: BP 121/83   Pulse (!) 119   Temp 98.4 F (36.9 C) (Oral)   Ht 5' 5.5" (1.664 m)   Wt (!) 355 lb 12.8 oz (161.4 kg)   SpO2 93%   BMI 58.31 kg/m   Physical Exam HENT:     Head: Normocephalic and atraumatic.     Nose: Nose normal.     Mouth/Throat:     Mouth: Mucous membranes are moist.     Pharynx: Oropharynx is clear.  Eyes:     Extraocular Movements: Extraocular movements intact.     Conjunctiva/sclera: Conjunctivae normal.     Pupils: Pupils are equal, round, and reactive to light.  Cardiovascular:     Rate and Rhythm: Tachycardia present.     Pulses: Normal pulses.     Heart sounds: Normal heart sounds.  Pulmonary:     Effort: Pulmonary effort is normal.     Breath sounds: Normal breath sounds.  Musculoskeletal:        General: Normal range of motion.     Right shoulder: Normal.     Left shoulder: Normal.     Right upper arm: Normal.  Left upper arm: Normal.     Right elbow: Normal.     Left elbow: Normal.     Right forearm: Normal.     Left forearm: Normal.     Right wrist: Normal.     Left wrist: Normal.     Right hand: Normal.     Left hand: Normal.     Cervical back: Normal, normal range of motion and neck supple.     Thoracic back: Normal.     Lumbar back: Normal.     Right hip: Normal.     Left hip: Normal.     Right upper leg: Normal.     Left upper leg: Normal.     Right knee: Normal.     Left knee: Normal.     Right lower leg: Normal.     Left lower leg: Normal.     Right ankle: Normal.     Left ankle: Normal.     Right foot: Swelling present.     Left foot: Swelling present.  Neurological:     General: No focal deficit present.     Mental Status: He is alert and oriented to person, place, and time.  Psychiatric:        Mood and Affect: Mood normal.        Behavior: Behavior normal.      ASSESSMENT AND PLAN: 1. Encounter for completion of form with patient 2. Bilateral swelling of feet 3. Flat feet, bilateral - Reasonable Accommodation Request Information - Health Care Provider Form completed.  - Referral to Podiatry for evaluation/management. - Ambulatory referral to Podiatry   Patient was given the opportunity to ask questions.  Patient verbalized understanding of the plan and was able to repeat key elements of the plan. Patient was given clear instructions to go to Emergency Department or return to medical center if symptoms don't improve, worsen, or new problems develop.The patient verbalized understanding.   Orders Placed This Encounter  Procedures   Ambulatory referral to Podiatry    Follow-up with primary provider as scheduled.   Rema Fendt, NP

## 2023-07-14 ENCOUNTER — Ambulatory Visit (INDEPENDENT_AMBULATORY_CARE_PROVIDER_SITE_OTHER): Payer: Managed Care, Other (non HMO) | Admitting: Podiatry

## 2023-07-14 ENCOUNTER — Ambulatory Visit (INDEPENDENT_AMBULATORY_CARE_PROVIDER_SITE_OTHER): Payer: Managed Care, Other (non HMO)

## 2023-07-14 DIAGNOSIS — M778 Other enthesopathies, not elsewhere classified: Secondary | ICD-10-CM

## 2023-07-14 DIAGNOSIS — M21961 Unspecified acquired deformity of right lower leg: Secondary | ICD-10-CM | POA: Diagnosis not present

## 2023-07-14 DIAGNOSIS — M21962 Unspecified acquired deformity of left lower leg: Secondary | ICD-10-CM | POA: Diagnosis not present

## 2023-07-14 MED ORDER — METHYLPREDNISOLONE 4 MG PO TBPK: ORAL_TABLET | ORAL | 0 refills | Status: AC

## 2023-07-14 MED ORDER — MELOXICAM 15 MG PO TABS: 15.0000 mg | ORAL_TABLET | Freq: Every day | ORAL | 0 refills | Status: AC

## 2023-07-14 NOTE — Progress Notes (Signed)
  Subjective:  Patient ID: Gary French, male    DOB: January 12, 1991,  MRN: [id number]  Chief Complaint  Patient presents with   Foot Pain    He has increased pain in the top of both feet,     32 y.o. male presents with the above complaint.  Patient presents with bilateral flatfoot deformity with pain on the lateral side of the foot likely due to being flat-footed.  He states he walks a lot on his foot he works for [employer].  He has not seen anyone else prior to seeing me.  His pain is increased.  Pain scale 7 out of 10 dull aching nature.  He is a smoker   Review of Systems: Negative except as noted in the HPI. Denies N/V/F/Ch.  Past Medical History:  Diagnosis Date   Anxiety     Current Outpatient Medications:    meloxicam (MOBIC) 15 MG tablet, Take 1 tablet (15 mg total) by mouth daily., Disp: 30 tablet, Rfl: 0   methylPREDNISolone (MEDROL DOSEPAK) 4 MG TBPK tablet, Take as directed, Disp: 21 each, Rfl: 0  Social History   Tobacco Use  Smoking Status Every Day   Current packs/day: 0.25   Average packs/day: 0.3 packs/day for 15.0 years (3.8 ttl pk-yrs)   Types: Cigarettes  Smokeless Tobacco Never    No Known Allergies Objective:  There were no vitals filed for this visit. There is no height or weight on file to calculate BMI. Constitutional Well developed. Well nourished.  Vascular Dorsalis pedis pulses palpable bilaterally. Posterior tibial pulses palpable bilaterally. Capillary refill normal to all digits.  No cyanosis or clubbing noted. Pedal hair growth normal.  Neurologic Normal speech. Oriented to person, place, and time. Epicritic sensation to light touch grossly present bilaterally.  Dermatologic Nails well groomed and normal in appearance. No open wounds. No skin lesions.  Orthopedic: Gait examination shows pes planovalgus deformity with calcaneovalgus to many toe signs partially but recurred the arch with dorsiflexion of the hallux unable to perform  single and double heel rise.   Radiographs: 3 views of skeletally mature the bilateral foot:There is creasing calcaneal inclination angle increasing talar declination angle anterior break in the cyma line midfoot arthritis noted.  No other bony abnormalities identified. Assessment:   1. Deformity of both feet    Plan:  Patient was evaluated and treated and all questions answered.  Bilateral pes planovalgus/foot deformity -.Pes planovalgus -I explained to patient the etiology of pes planovalgus and relationship with Planter fasciitis and various treatment options were discussed.  Given patient foot structure in the setting of Planter fasciitis I believe patient will benefit from custom-made orthotics to help control the hindfoot motion support the arch of the foot and take the stress away from plantar fascial.  Patient agrees with the plan like to proceed with orthotics -Patient was casted for orthotics   No follow-ups on file.

## 2023-08-19 ENCOUNTER — Telehealth: Payer: Self-pay

## 2023-08-19 NOTE — Telephone Encounter (Signed)
LEFT PT VM to schedule orthotic pick up

## 2023-08-31 ENCOUNTER — Encounter (INDEPENDENT_AMBULATORY_CARE_PROVIDER_SITE_OTHER): Payer: Self-pay

## 2023-09-15 ENCOUNTER — Encounter (INDEPENDENT_AMBULATORY_CARE_PROVIDER_SITE_OTHER): Payer: Self-pay

## 2023-10-28 ENCOUNTER — Ambulatory Visit: Payer: Managed Care, Other (non HMO)

## 2023-10-29 NOTE — Progress Notes (Signed)
 Patient presents today to pick up custom molded foot orthotics, diagnosed with BIL foot deformity by Dr. Allena Katz.   Orthotics were dispensed and fit was satisfactory. Reviewed instructions for break-in and wear. Written instructions given to patient.  Patient will follow up as needed. Gary French Cped, CFo, CFm

## 2023-11-03 ENCOUNTER — Encounter: Payer: Self-pay | Admitting: Family

## 2023-11-03 ENCOUNTER — Ambulatory Visit (INDEPENDENT_AMBULATORY_CARE_PROVIDER_SITE_OTHER): Payer: Self-pay | Admitting: Family

## 2023-11-03 VITALS — BP 117/79 | HR 118 | Temp 98.6°F | Ht 65.5 in | Wt 360.0 lb

## 2023-11-03 DIAGNOSIS — R0683 Snoring: Secondary | ICD-10-CM | POA: Diagnosis not present

## 2023-11-03 NOTE — Progress Notes (Unsigned)
 Patient asking for sleep study to be done.

## 2023-11-03 NOTE — Progress Notes (Unsigned)
 Patient ID: Gary French, male    DOB: 05/06/1991  MRN: [id number]  CC: Sleep Apnea  Subjective: Gary French is a 33 y.o. male who presents for sleep apnea.   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 30 tablet 0   methylPREDNISolone (MEDROL DOSEPAK) 4 MG TBPK tablet Take as directed 21 each 0   No current facility-administered medications on file prior to visit.    No Known Allergies  Social History   Socioeconomic History   Marital status: Unknown    Spouse name: Not on file   Number of children: Not on file   Years of education: Not on file   Highest education level: Not on file  Occupational History   Not on file  Tobacco Use   Smoking status: Every Day    Current packs/day: 0.25    Average packs/day: 0.3 packs/day for 15.0 years (3.8 ttl pk-yrs)    Types: Cigarettes   Smokeless tobacco: Never  Vaping Use   Vaping status: Never Used  Substance and Sexual Activity   Alcohol use: Yes   Drug use: No   Sexual activity: Not on file  Other Topics Concern   Not on file  Social History Narrative   Not on file   Social Drivers of Health   Financial Resource Strain: Not on file  Food Insecurity: Not on file  Transportation Needs: Not on file  Physical Activity: Not on file  Stress: Not on file  Social Connections: Not on file  Intimate Partner Violence: Not on file    No family history on file.  Past Surgical History:  Procedure Laterality Date   NO PAST SURGERIES      ROS: Review of Systems Negative except as stated above  PHYSICAL EXAM: There were no vitals taken for this visit.  Physical Exam  {male adult master:310786} {male adult master:310785}      Latest Ref Rng & Units 12/22/2022    4:28 PM 04/28/2021    5:00 PM  CMP  Glucose 70 - 99 mg/dL 92  89   BUN 6 - 20 mg/dL 13  12   Creatinine 4.09 - 1.27 mg/dL 8.11  9.14   Sodium 782 - 144 mmol/L 142  141   Potassium 3.5 - 5.2 mmol/L 4.5  4.6   Chloride 96 - 106 mmol/L 101  101   CO2 20 - 29 mmol/L 20  25   Calcium 8.7 - 10.2 mg/dL 95.6  9.9   Total Protein 6.0 - 8.5 g/dL 7.4  7.6   Total Bilirubin 0.0 - 1.2 mg/dL 0.6  0.4   Alkaline Phos 44 - 121 IU/L 85  71   AST 0 - 40 IU/L 32  17   ALT 0 - 44 IU/L 36  27    Lipid Panel     Component Value Date/Time   CHOL 143 12/22/2022 1628   TRIG 86 12/22/2022 1628   HDL 48 12/22/2022 1628   CHOLHDL 3.0 12/22/2022 1628   LDLCALC 79 12/22/2022 1628    CBC    Component Value Date/Time   WBC 10.7 04/28/2021 1700   RBC 5.60 04/28/2021 1700   HGB 15.8 04/28/2021 1700  HCT 47.6 04/28/2021 1700   PLT 312 04/28/2021 1700   MCV 85 04/28/2021 1700   MCH 28.2 04/28/2021 1700   MCHC 33.2 04/28/2021 1700   RDW 12.7 04/28/2021 1700    ASSESSMENT AND PLAN:  There are no diagnoses linked to this encounter.   Patient was given the opportunity to ask questions.  Patient verbalized understanding of the plan and was able to repeat key elements of the plan. Patient was given clear instructions to go to Emergency Department or return to medical center if symptoms don't improve, worsen, or new problems develop.The patient verbalized understanding.   No orders of the defined types were placed in this encounter.    Requested Prescriptions    No prescriptions requested or ordered in this encounter    No follow-ups on file.  Rema Fendt, NP

## 2023-11-18 ENCOUNTER — Ambulatory Visit: Payer: Self-pay

## 2023-11-18 NOTE — Telephone Encounter (Signed)
 Copied from CRM [id number]. Topic: Clinical - Medical Advice >> Nov 18, 2023 10:09 AM Opal Bill wrote: Reason for CRM: Pt says he has toenail fungus on a couple of his toes on his right foot. Asking if he can be prescribed something for toenail fungus.   Chief Complaint: Fungus of toes  Symptoms: Fungus to the toes of his bilateral feet  Frequency: Constant, worsening  Pertinent Negatives: Patient denies pain, fever, other symptoms Disposition: [] ED /[] Urgent Care (no appt availability in office) / [x] Appointment(In office/virtual)/ []  [facility]/ [] Home Care/ [x] Refused Recommended Disposition /[] [facility]/ []  Follow-up with PCP Additional Notes: Patient calling to request medication for fungus of the toenails of his bilateral feet. He states that the fungus has been present "for a while now." He denies any pain or associated symptom at this time. Patient states he does not want to make an appointment, stating "my mother had fungus and she called her doctor and was able to get a prescription." I advised him that I could not guarantee that he would get a prescription without a visit and that he could also try OTC anti-fungal medication from a pharmacy. Patient declined stating he would like to have something prescribed. Patient is requesting a response to his request soon if possible.      Reason for Disposition  Nursing judgment or information in reference  Answer Assessment - Initial Assessment Questions 1. REASON FOR CALL: "What is your main concern right now?"     Fungus to toenails on his bilateral feet 2. ONSET: "When did the fungus start?"     "It's been a little while now." 3. SEVERITY: "How bad is the fungus?"     Partially affecting toenail but is worsening  4. FEVER: "Do you have a fever?"     No 5. OTHER SYMPTOMS: "Do you have any other new symptoms?"     No 6. TREATMENTS AND RESPONSE: "What have you done so far to try to make this better? What medicines  have you used?"     Nothing  Protocols used: No Guideline Available-[facility]

## 2023-11-18 NOTE — Telephone Encounter (Signed)
 1st attempt - received vmb. Lvm to return call. Will continue to attempt.  Copied from CRM [id number]. Topic: Clinical - Medical Advice >> Nov 18, 2023 10:09 AM Gary French wrote: Reason for CRM: Pt says he has toenail fungus on a couple of his toes on his right foot. Asking if he can be prescribed something for toenail fungus.

## 2023-11-18 NOTE — Telephone Encounter (Signed)
 Copied from CRM [id number]. Topic: Clinical - Medical Advice >> Nov 18, 2023 10:09 AM Gary French wrote: Reason for CRM: Pt says he has toenail fungus on a couple of his toes on his right foot. Asking if he can be prescribed something for toenail fungus. Called pt and LM on VM to call back

## 2023-11-22 NOTE — Telephone Encounter (Signed)
 Patient scheduled a appointment to come in

## 2023-11-23 NOTE — Telephone Encounter (Signed)
 Schedule appointment. During the interim report to Emergency Department/Urgent Care/call 911 for immediate medical evaluation.

## 2023-11-23 NOTE — Telephone Encounter (Signed)
 I called patient and made him aware that the PCP stated  Schedule appointment. During the interim report to Emergency Department/Urgent Care/call 911 for immediate medical evaluation.

## 2023-11-26 ENCOUNTER — Encounter: Payer: Self-pay | Admitting: Family

## 2023-11-26 ENCOUNTER — Ambulatory Visit (INDEPENDENT_AMBULATORY_CARE_PROVIDER_SITE_OTHER): Admitting: Family

## 2023-11-26 VITALS — BP 128/87 | HR 109 | Temp 98.5°F | Resp 20 | Ht 65.0 in | Wt 358.8 lb

## 2023-11-26 DIAGNOSIS — B351 Tinea unguium: Secondary | ICD-10-CM | POA: Diagnosis not present

## 2023-11-26 DIAGNOSIS — B353 Tinea pedis: Secondary | ICD-10-CM

## 2023-11-26 MED ORDER — CLOTRIMAZOLE 1 % EX CREA
1.0000 | TOPICAL_CREAM | Freq: Two times a day (BID) | CUTANEOUS | 2 refills | Status: AC
Start: 2023-11-26 — End: ?

## 2023-11-26 NOTE — Progress Notes (Signed)
Foot fungus

## 2023-11-26 NOTE — Progress Notes (Signed)
 Patient ID: Gary French, male    DOB: 1991/04/30  MRN: [id number]  CC: Foot Fungus  Subjective: Gary French is a 33 y.o. male who presents for foot fungus.   His concerns today include:  Bilateral fungus of feet including toenails persisting. No further issues/concerns for discussion today.  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 (Patient not taking: Reported on 11/03/2023) 30 tablet 0   methylPREDNISolone  (MEDROL  DOSEPAK) 4 MG TBPK tablet Take as directed (Patient not taking: Reported on 11/03/2023) 21 each 0   No current facility-administered medications on file prior to visit.    No Known Allergies  Social History   Socioeconomic History   Marital status: Unknown    Spouse name: Not on file   Number of children: Not on file   Years of education: Not on file   Highest education level: Not on file  Occupational History   Not on file  Tobacco Use   Smoking status: Every Day    Current packs/day: 0.25    Average packs/day: 0.3 packs/day for 15.0 years (3.8 ttl pk-yrs)    Types: Cigarettes   Smokeless tobacco: Never  Vaping Use   Vaping status: Never Used  Substance and Sexual Activity   Alcohol use: Yes   Drug use: No   Sexual activity: Not on file  Other Topics Concern   Not on file  Social History Narrative   Not on file   Social Drivers of Health   Financial Resource Strain: Low Risk  (11/26/2023)   Overall Financial Resource Strain (CARDIA)    Difficulty of Paying Living Expenses: Not hard at all  Food Insecurity: No Food Insecurity (11/26/2023)   Hunger Vital Sign    Worried About Running Out of Food in the Last Year: Never true    Ran Out of Food in the Last Year: Never true  Transportation Needs: No  Transportation Needs (11/26/2023)   PRAPARE - Administrator, Civil Service (Medical): No    Lack of Transportation (Non-Medical): No  Physical Activity: Insufficiently Active (11/26/2023)   Exercise Vital Sign    Days of Exercise per Week: 3 days    Minutes of Exercise per Session: 30 min  Stress: No Stress Concern Present (11/26/2023)   [employer] of Occupational Health - Occupational Stress Questionnaire    Feeling of Stress : Not at all  Social Connections: Socially Isolated (11/26/2023)   Social Connection and Isolation Panel [NHANES]    Frequency of Communication with Friends and Family: More than three times a week    Frequency of Social Gatherings with Friends and Family: More than three times a week    Attends Religious Services: Never    Database administrator or Organizations: No    Attends Banker Meetings: Never    Marital Status: Never married  Intimate Partner Violence: Not At Risk (11/26/2023)   Humiliation, Afraid, Rape, and Kick questionnaire    Fear of Current or Ex-Partner: No    Emotionally Abused: No    Physically Abused: No    Sexually Abused: No    History reviewed. No pertinent family history.  Past Surgical History:  Procedure Laterality Date   NO PAST SURGERIES  ROS: Review of Systems Negative except as stated above  PHYSICAL EXAM: BP 128/87   Pulse (!) 109   Temp 98.5 F (36.9 C) (Oral)   Resp 20   Ht 5\' 5"  (1.651 m)   Wt (!) 358 lb 12.8 oz (162.8 kg)   SpO2 95%   BMI 59.71 kg/m   Physical Exam HENT:     Head: Normocephalic and atraumatic.     Nose: Nose normal.     Mouth/Throat:     Mouth: Mucous membranes are moist.     Pharynx: Oropharynx is clear.  Eyes:     Extraocular Movements: Extraocular movements intact.     Conjunctiva/sclera: Conjunctivae normal.     Pupils: Pupils are equal, round, and reactive to light.  Cardiovascular:     Rate and Rhythm: Tachycardia present.     Pulses: Normal  pulses.     Heart sounds: Normal heart sounds.  Pulmonary:     Effort: Pulmonary effort is normal.     Breath sounds: Normal breath sounds.  Musculoskeletal:        General: Normal range of motion.     Cervical back: Normal range of motion and neck supple.  Feet:     Right foot:     Toenail Condition: Fungal disease present.    Left foot:     Toenail Condition: Fungal disease present.    Comments: Tinea pedis bilateral. Neurological:     General: No focal deficit present.     Mental Status: He is alert and oriented to person, place, and time.  Psychiatric:        Mood and Affect: Mood normal.        Behavior: Behavior normal.    ASSESSMENT AND PLAN: 1. Tinea pedis of both feet (Primary) 2. Onychomycosis - Clotrimazole as prescribed. Counseled on medication adherence/adverse effects.  - Referral to Podiatry for evaluation/management. - clotrimazole (LOTRIMIN) 1 % cream; Apply 1 Application topically 2 (two) times daily.  Dispense: 60 g; Refill: 2 - Ambulatory referral to Podiatry   Patient was given the opportunity to ask questions.  Patient verbalized understanding of the plan and was able to repeat key elements of the plan. Patient was given clear instructions to go to Emergency Department or return to medical center if symptoms don't improve, worsen, or new problems develop.The patient verbalized understanding.   Orders Placed This Encounter  Procedures   Ambulatory referral to Podiatry     Requested Prescriptions   Signed Prescriptions Disp Refills   clotrimazole (LOTRIMIN) 1 % cream 60 g 2    Sig: Apply 1 Application topically 2 (two) times daily.    Follow-up with primary provider as scheduled.  Senaida Dama, NP

## 2023-12-08 ENCOUNTER — Encounter: Payer: Self-pay | Admitting: Family

## 2023-12-08 ENCOUNTER — Ambulatory Visit (INDEPENDENT_AMBULATORY_CARE_PROVIDER_SITE_OTHER): Admitting: Family

## 2023-12-08 VITALS — BP 117/77 | HR 110 | Temp 98.3°F | Resp 20 | Ht 66.0 in | Wt 359.8 lb

## 2023-12-08 DIAGNOSIS — M7989 Other specified soft tissue disorders: Secondary | ICD-10-CM

## 2023-12-08 DIAGNOSIS — M2141 Flat foot [pes planus] (acquired), right foot: Secondary | ICD-10-CM | POA: Diagnosis not present

## 2023-12-08 DIAGNOSIS — M2142 Flat foot [pes planus] (acquired), left foot: Secondary | ICD-10-CM | POA: Diagnosis not present

## 2023-12-08 DIAGNOSIS — Z0289 Encounter for other administrative examinations: Secondary | ICD-10-CM

## 2023-12-08 NOTE — Progress Notes (Signed)
 Patient ID: Brently Stawski, male    DOB: [birth year]  MRN: [id number]  CC: FMLA paperwork   Subjective: Gary French is a 33 y.o. male who presents for FMLA paperwork.   His concerns today include:  Patient presents for completion of Certification of Health Care Provider for Employee's Serious Health Condition under the Family and Medical Leave Act. Reports he is a full time employee at [employer], job title: Administrator, Civil Service. States he plans to return Podiatry call to schedule appointment.   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 60 g 2   meloxicam  (MOBIC ) 15 MG tablet Take 1 tablet (15 mg total) by mouth daily. (Patient not taking: Reported on 11/03/2023) 30 tablet 0   methylPREDNISolone  (MEDROL  DOSEPAK) 4 MG TBPK tablet Take as directed (Patient not taking: Reported on 11/03/2023) 21 each 0   No current facility-administered medications on file prior to visit.    No Known Allergies  Social History   Socioeconomic History   Marital status: Unknown    Spouse name: Not on file   Number of children: Not on file   Years of education: Not on file   Highest education level: Not on file  Occupational History   Not on file  Tobacco Use   Smoking status: Every Day    Current packs/day: 0.25    Average packs/day: 0.3 packs/day for 15.0 years (3.8 ttl pk-yrs)    Types: Cigarettes   Smokeless tobacco: Never  Vaping Use   Vaping status: Never Used  Substance and Sexual Activity   Alcohol use: Yes   Drug use: No   Sexual activity: Not Currently  Other Topics Concern   Not on file  Social History Narrative   Not on file   Social Drivers of Health   Financial Resource Strain: Low Risk  (11/26/2023)   Overall Financial  Resource Strain (CARDIA)    Difficulty of Paying Living Expenses: Not hard at all  Food Insecurity: No Food Insecurity (11/26/2023)   Hunger Vital Sign    Worried About Running Out of Food in the Last Year: Never true    Ran Out of Food in the Last Year: Never true  Transportation Needs: No Transportation Needs (11/26/2023)   PRAPARE - Administrator, Civil Service (Medical): No    Lack of Transportation (Non-Medical): No  Physical Activity: Insufficiently Active (11/26/2023)   Exercise Vital Sign    Days of Exercise per Week: 3 days    Minutes of Exercise per Session: 30 min  Stress: No Stress Concern Present (11/26/2023)   [employer] of Occupational Health - Occupational Stress Questionnaire    Feeling of Stress : Not at all  Social Connections: Socially Isolated (11/26/2023)   Social Connection and Isolation Panel [NHANES]    Frequency of Communication with Friends and Family: More than three times a week    Frequency of Social Gatherings with Friends and Family: More than three times a week    Attends Religious Services: Never    Database administrator or Organizations: No    Attends Banker Meetings: Never    Marital Status: Never married  Intimate Partner Violence: Not At Risk (11/26/2023)   Humiliation, Afraid, Rape, and Kick  questionnaire    Fear of Current or Ex-Partner: No    Emotionally Abused: No    Physically Abused: No    Sexually Abused: No    History reviewed. No pertinent family history.  Past Surgical History:  Procedure Laterality Date   NO PAST SURGERIES      ROS: Review of Systems Negative except as stated above  PHYSICAL EXAM: BP 117/77   Pulse (!) 110   Temp 98.3 F (36.8 C) (Oral)   Resp 20   Ht 5\' 6"  (1.676 m)   Wt (!) 359 lb 12.8 oz (163.2 kg)   SpO2 93%   BMI 58.07 kg/m   Physical Exam HENT:     Head: Normocephalic and atraumatic.     Nose: Nose normal.     Mouth/Throat:     Mouth: Mucous membranes are  moist.     Pharynx: Oropharynx is clear.  Eyes:     Extraocular Movements: Extraocular movements intact.     Conjunctiva/sclera: Conjunctivae normal.     Pupils: Pupils are equal, round, and reactive to light.  Cardiovascular:     Rate and Rhythm: Tachycardia present.     Pulses: Normal pulses.     Heart sounds: Normal heart sounds.  Pulmonary:     Effort: Pulmonary effort is normal.     Breath sounds: Normal breath sounds.  Musculoskeletal:        General: Normal range of motion.     Cervical back: Normal range of motion and neck supple.  Neurological:     General: No focal deficit present.     Mental Status: He is alert and oriented to person, place, and time.  Psychiatric:        Mood and Affect: Mood normal.        Behavior: Behavior normal.     ASSESSMENT AND PLAN: 1. Encounter for completion of form with patient (Primary) 2. Bilateral swelling of feet 3. Flat feet, bilateral - Certification of Health Care Provider for Employee's Serious Health Condition under the Family and Medical Leave Act form completed.  - Keep all scheduled appointments with Podiatry.   Patient was given the opportunity to ask questions.  Patient verbalized understanding of the plan and was able to repeat key elements of the plan. Patient was given clear instructions to go to Emergency Department or return to medical center if symptoms don't improve, worsen, or new problems develop.The patient verbalized understanding.  Follow-up with primary provider as scheduled.  Senaida Dama, NP

## 2023-12-20 ENCOUNTER — Encounter: Payer: Self-pay | Admitting: Family Medicine

## 2023-12-20 ENCOUNTER — Ambulatory Visit (INDEPENDENT_AMBULATORY_CARE_PROVIDER_SITE_OTHER): Admitting: Family Medicine

## 2023-12-20 VITALS — BP 134/87 | HR 107 | Temp 98.1°F | Resp 18 | Ht 65.0 in | Wt 369.0 lb

## 2023-12-20 DIAGNOSIS — Z13220 Encounter for screening for lipoid disorders: Secondary | ICD-10-CM

## 2023-12-20 DIAGNOSIS — Z13228 Encounter for screening for other metabolic disorders: Secondary | ICD-10-CM

## 2023-12-20 DIAGNOSIS — Z1329 Encounter for screening for other suspected endocrine disorder: Secondary | ICD-10-CM

## 2023-12-20 DIAGNOSIS — Z130 Encounter for screening for diseases of the blood and blood-forming organs and certain disorders involving the immune mechanism: Secondary | ICD-10-CM

## 2023-12-20 DIAGNOSIS — Z0000 Encounter for general adult medical examination without abnormal findings: Secondary | ICD-10-CM | POA: Diagnosis not present

## 2023-12-20 NOTE — Progress Notes (Signed)
 Established Patient Office Visit  Subjective    Patient ID: Gary French, male    DOB: 1991/07/23  Age: 33 y.o. MRN: [id number]  CC: No chief complaint on file.   HPI [name] presents for rotine annual exam. Patient denies acute complaints.   Outpatient Encounter Medications as of 12/20/2023  Medication Sig   clotrimazole  (LOTRIMIN ) 1 % cream Apply 1 Application topically 2 (two) times daily.   meloxicam  (MOBIC ) 15 MG tablet Take 1 tablet (15 mg total) by mouth daily.   methylPREDNISolone  (MEDROL  DOSEPAK) 4 MG TBPK tablet Take as directed   No facility-administered encounter medications on file as of 12/20/2023.    Past Medical History:  Diagnosis Date   Anxiety     Past Surgical History:  Procedure Laterality Date   NO PAST SURGERIES      No family history on file.  Social History   Socioeconomic History   Marital status: Unknown    Spouse name: Not on file   Number of children: Not on file   Years of education: Not on file   Highest education level: Not on file  Occupational History   Not on file  Tobacco Use   Smoking status: Every Day    Current packs/day: 0.25    Average packs/day: 0.3 packs/day for 15.0 years (3.8 ttl pk-yrs)    Types: Cigarettes   Smokeless tobacco: Never  Vaping Use   Vaping status: Never Used  Substance and Sexual Activity   Alcohol use: Yes   Drug use: No   Sexual activity: Not Currently  Other Topics Concern   Not on file  Social History Narrative   Not on file   Social Drivers of Health   Financial Resource Strain: Low Risk  (11/26/2023)   Overall Financial Resource Strain (CARDIA)    Difficulty of Paying Living Expenses: Not hard at all  Food Insecurity: No Food Insecurity (11/26/2023)   Hunger Vital Sign    Worried About Running Out of Food in the Last Year: Never true    Ran Out of Food in the Last Year: Never true  Transportation Needs: No Transportation Needs (11/26/2023)   PRAPARE - Therapist, art (Medical): No    Lack of Transportation (Non-Medical): No  Physical Activity: Insufficiently Active (11/26/2023)   Exercise Vital Sign    Days of Exercise per Week: 3 days    Minutes of Exercise per Session: 30 min  Stress: No Stress Concern Present (11/26/2023)   [employer] of Occupational Health - Occupational Stress Questionnaire    Feeling of Stress : Not at all  Social Connections: Socially Isolated (11/26/2023)   Social Connection and Isolation Panel [NHANES]    Frequency of Communication with Friends and Family: More than three times a week    Frequency of Social Gatherings with Friends and Family: More than three times a week    Attends Religious Services: Never    Database administrator or Organizations: No    Attends Banker Meetings: Never    Marital Status: Never married  Intimate Partner Violence: Not At Risk (11/26/2023)   Humiliation, Afraid, Rape, and Kick questionnaire    Fear of Current or Ex-Partner: No    Emotionally Abused: No    Physically Abused: No    Sexually Abused: No    Review of Systems  All other systems reviewed and are negative.       Objective    BP 134/87  Pulse (!) 107   Temp 98.1 F (36.7 C) (Oral)   Resp 18   Ht 5\' 5"  (1.651 m)   Wt (!) 369 lb (167.4 kg)   SpO2 99%   BMI 61.40 kg/m   Physical Exam Vitals and nursing note reviewed.  Constitutional:      General: He is not in acute distress. HENT:     Head: Normocephalic and atraumatic.     Right Ear: Tympanic membrane, ear canal and external ear normal.     Left Ear: Tympanic membrane, ear canal and external ear normal.     Nose: Nose normal.     Mouth/Throat:     Mouth: Mucous membranes are moist.     Pharynx: Oropharynx is clear.  Eyes:     Conjunctiva/sclera: Conjunctivae normal.     Pupils: Pupils are equal, round, and reactive to light.  Neck:     Thyroid: No thyromegaly.  Cardiovascular:     Rate and Rhythm: Normal rate and  regular rhythm.     Heart sounds: Normal heart sounds. No murmur heard. Pulmonary:     Effort: Pulmonary effort is normal.     Breath sounds: Normal breath sounds.  Abdominal:     General: There is no distension.     Palpations: Abdomen is soft. There is no mass.     Tenderness: There is no abdominal tenderness.     Hernia: There is no hernia in the left inguinal area or right inguinal area.  Musculoskeletal:        General: Normal range of motion.     Cervical back: Normal range of motion and neck supple.     Right lower leg: No edema.     Left lower leg: No edema.  Skin:    General: Skin is warm and dry.  Neurological:     General: No focal deficit present.     Mental Status: He is alert and oriented to person, place, and time. Mental status is at baseline.  Psychiatric:        Mood and Affect: Mood normal.        Behavior: Behavior normal.         Assessment & Plan:   Annual physical exam -     CMP14+EGFR  Screening for deficiency anemia -     CBC with Differential/Platelet  Screening for lipid disorders -     Lipid panel  Screening for endocrine/metabolic/immunity disorders -     Hemoglobin A1c     No follow-ups on file.   [name], MD

## 2023-12-22 ENCOUNTER — Other Ambulatory Visit

## 2023-12-29 ENCOUNTER — Telehealth: Payer: Self-pay

## 2023-12-29 ENCOUNTER — Other Ambulatory Visit: Payer: Self-pay | Admitting: Family

## 2023-12-29 DIAGNOSIS — Z0130 Encounter for examination of blood pressure without abnormal findings: Secondary | ICD-10-CM

## 2023-12-29 NOTE — Telephone Encounter (Signed)
 Complete

## 2023-12-29 NOTE — Telephone Encounter (Signed)
 Copied from CRM [id number]. Topic: Referral - Question >> Dec 29, 2023  2:43 PM Sasha H wrote: Reason for CRM: Pt is wanting a cardiology referral.

## 2023-12-30 NOTE — Telephone Encounter (Signed)
 Called patient and left voicemail.

## 2024-01-12 ENCOUNTER — Ambulatory Visit (INDEPENDENT_AMBULATORY_CARE_PROVIDER_SITE_OTHER): Admitting: Podiatry

## 2024-01-12 DIAGNOSIS — B353 Tinea pedis: Secondary | ICD-10-CM

## 2024-01-12 DIAGNOSIS — B999 Unspecified infectious disease: Secondary | ICD-10-CM

## 2024-01-12 MED ORDER — DOXYCYCLINE HYCLATE 100 MG PO TABS: 100.0000 mg | ORAL_TABLET | Freq: Two times a day (BID) | ORAL | 0 refills | Status: AC

## 2024-01-12 MED ORDER — TERBINAFINE HCL 250 MG PO TABS: 250.0000 mg | ORAL_TABLET | Freq: Every day | ORAL | 0 refills | Status: AC

## 2024-01-12 MED ORDER — CLOTRIMAZOLE-BETAMETHASONE 1-0.05 % EX CREA: 1.0000 | TOPICAL_CREAM | Freq: Every day | CUTANEOUS | 0 refills | Status: AC

## 2024-01-12 NOTE — Progress Notes (Signed)
  Subjective:  Patient ID: Gary French, male    DOB: [birth year],  MRN: [id number]  Chief Complaint  Patient presents with   Tinea Pedis    Pt stated that his feet are starting to itch     33 y.o. male presents with the above complaint.  Patient presents with lateral plantar foot athlete's foot/superinfection.  Patient states been present for quite some day just came out of nowhere.  Started to itch wanted get it evaluated causes sores and some discomfort.  No open wounds or lesion.  Pain scale is 2 out of 10 dull aching nature   Review of Systems: Negative except as noted in the HPI. Denies N/V/F/Ch.  Past Medical History:  Diagnosis Date   Anxiety     Current Outpatient Medications:    clotrimazole  (LOTRIMIN ) 1 % cream, Apply 1 Application topically 2 (two) times daily., Disp: 60 g, Rfl: 2   clotrimazole -betamethasone (LOTRISONE) cream, Apply 1 Application topically daily., Disp: 30 g, Rfl: 0   doxycycline (VIBRA-TABS) 100 MG tablet, Take 1 tablet (100 mg total) by mouth 2 (two) times daily., Disp: 20 tablet, Rfl: 0   meloxicam  (MOBIC ) 15 MG tablet, Take 1 tablet (15 mg total) by mouth daily., Disp: 30 tablet, Rfl: 0   methylPREDNISolone  (MEDROL  DOSEPAK) 4 MG TBPK tablet, Take as directed, Disp: 21 each, Rfl: 0   terbinafine (LAMISIL) 250 MG tablet, Take 1 tablet (250 mg total) by mouth daily., Disp: 30 tablet, Rfl: 0  Social History   Tobacco Use  Smoking Status Every Day   Current packs/day: 0.25   Average packs/day: 0.3 packs/day for 15.0 years (3.8 ttl pk-yrs)   Types: Cigarettes  Smokeless Tobacco Never    No Known Allergies Objective:  There were no vitals filed for this visit. There is no height or weight on file to calculate BMI. Constitutional Well developed. Well nourished.  Vascular Dorsalis pedis pulses palpable bilaterally. Posterior tibial pulses palpable bilaterally. Capillary refill normal to all digits.  No cyanosis or clubbing noted. Pedal hair  growth normal.  Neurologic Normal speech. Oriented to person, place, and time. Epicritic sensation to light touch grossly present bilaterally.  Dermatologic Bilateral feet subjective complaint of itching noted with skin epidermolysis and peeling.  No open wounds or lesion noted.  Orthopedic: Normal joint ROM without pain or crepitus bilaterally. No visible deformities. No bony tenderness.   Radiographs: None Assessment:   1. Superinfection   2. Tinea pedis of both feet    Plan:  Patient was evaluated and treated and all questions answered.  Bilateral athlete's foot with superinfection - All questions and concerns were discussed with the patient extensive detail given the amount of athlete's foot that is present patient will benefit from Lotrisone cream twice a day to be applied daily as well as Lamisil to be taken orally daily and doxycycline for antibacterial - Discussed prevention technique as well  No follow-ups on file.

## 2024-03-23 ENCOUNTER — Telehealth: Payer: Self-pay | Admitting: Emergency Medicine

## 2024-03-23 NOTE — Telephone Encounter (Signed)
 Appt scheduled

## 2024-03-23 NOTE — Telephone Encounter (Signed)
 Copied from CRM #[id number]. Topic: Appointments - Appointment Scheduling >> Mar 23, 2024  2:48 PM Pinkey ORN wrote: Patient/patient representative is calling to schedule an appointment. Refer to attachments for appointment information.

## 2024-03-23 NOTE — Telephone Encounter (Signed)
 Copied from CRM [id number]. Topic: Clinical - Medication Question >> Mar 23, 2024  2:44 PM Pinkey ORN wrote: Reason for CRM: Prescription Request

## 2024-03-24 NOTE — Telephone Encounter (Signed)
 Noted

## 2024-03-31 ENCOUNTER — Encounter: Payer: Self-pay | Admitting: Internal Medicine

## 2024-04-04 ENCOUNTER — Ambulatory Visit (HOSPITAL_BASED_OUTPATIENT_CLINIC_OR_DEPARTMENT_OTHER): Attending: Family | Admitting: Internal Medicine

## 2024-04-04 DIAGNOSIS — G4736 Sleep related hypoventilation in conditions classified elsewhere: Secondary | ICD-10-CM | POA: Insufficient documentation

## 2024-04-04 DIAGNOSIS — R0683 Snoring: Secondary | ICD-10-CM | POA: Insufficient documentation

## 2024-04-04 DIAGNOSIS — G4733 Obstructive sleep apnea (adult) (pediatric): Secondary | ICD-10-CM | POA: Insufficient documentation

## 2024-04-09 NOTE — Procedures (Signed)
 [facility] [address] [place], [place] [address] Tel: [phone]   Fax: [phone]  Polysomnography Interpretation  Patient Name:  Gary French Rear Date:  04/04/2024 Referring Physician:  AMY STEPHENS [id number]) %%startinterp%% Indications for Polysomnography The patient is a 33 year old Male who is 5' 5 and weighs 360.0 lbs. His BMI equals 60.0.  A full night polysomnogram was performed to evaluate for -OSA  Medications Taken:  NO MEDICATIONS TAKEN.   Polysomnogram Data A full night polysomnogram recorded the standard physiologic parameters including EEG, EOG, EMG, EKG, nasal and oral airflow.  Respiratory parameters of chest and abdominal movements were recorded with Respiratory Inductance Plethysmography belts.  Oxygen saturation was recorded by pulse oximetry.   Sleep Architecture The total recording time of the polysomnogram was 440.6 minutes.  The total sleep time was 412.5 minutes.  The patient spent 3.0% of total sleep time in Stage N1, 25.2% in Stage N2, 70.4% in Stages N3, and 1.3% in REM.  Sleep latency was 0.0 minutes.  REM latency was 366.0 minutes.  Sleep Efficiency was 93.6%.  Wake after Sleep Onset time was 28.5 minutes.  Respiratory Events The polysomnogram revealed a presence of 405 obstructive, 9 central, and 165 mixed apneas resulting in an Apnea index of 84.2 events per hour.  There were 395 hypopneas (>=3% desaturation and/or arousal) resulting in an Apnea\Hypopnea Index (AHI >=3% desaturation and/or arousal) of 141.7 events per hour.  There were 368 hypopneas (>=4% desaturation) resulting in an Apnea\Hypopnea Index (AHI >=4% desaturation) of 137.7 events per hour.  There were 12 Respiratory Effort Related Arousals resulting in a RERA index of 1.7 events per hour. The Respiratory Disturbance Index is 143.4 events per hour.  The snore index was 290.6 events per hour.  Mean oxygen saturation was 82.5%.  The lowest  oxygen saturation during sleep was 63.0%.  Time spent <=88% oxygen saturation was 293.1 minutes (67.6%).  Limb Activity There were - total limb movements recorded, of this total, - were classified as PLMs.  PLM index was - per hour and PLM associated with Arousals index was - per hour.  Cardiac Summary The average pulse rate was 103.0 bpm.  The minimum pulse rate was 71.0 bpm while the maximum pulse rate was 123.0 bpm.  Cardiac rhythm was abnormal- sinus tachycardia.  Comments: Very severe obstructive sleep apnea, AHI (3%) 141.7/hr. Snoring with oxygen desaturation to a nadir of 63%, mean 82.5% indicating Nocturnal Hypoxemia.  Diagnosis: Obstructive sleep apnea, Nocturnal Hypoxemia  Recommendations: Suggest CPAP titration sleep study, which would allow transition to BIPAP if needed, and would document for insurance if supplemental O2 is also needed.   Signed: Reggy Salt, MD Accredited Clinical Polysomnographer [facility] [address] [place], [place] [address] Tel: [phone]   Fax: [phone] Date/ Time   04/09/24  11:54    Diagnostic PSG Report  Patient Name: Gary French Rear Date: 04/04/2024  Date of Birth: 09/02/1990 Study Type: Diagnostic  Age: 32 year MRN #: [id number]  Sex: Male Interpreting Physician: SALT REGGY, [id number]  Height: 5' 5 Referring Physician: AMY STEPHENS ([id number])  Weight: 360.0 lbs Recording Tech: Richard Correia RPSGT  BMI: 60.0 Scoring Tech: Charlie George RPSGT  ESS: 7 Neck Size: 19   Study Overview  Lights Off: 09:40:47 PM  Count Index  Lights On: 05:01:24 AM Awakenings: 23 3.3  Time in Bed: 440.6 min. Arousals: 858 124.8  Total Sleep Time: 412.5 min.  AHI (>=3% Desat and/or Ar.): 974 141.7   Sleep Efficiency: 93.6% AHI (>=4% Desat): 947 137.7   Sleep Latency: 0.0 min. Limb Movements: - -  Wake After Sleep Onset: 28.5 min. Snore: 1998 290.6  REM Latency from Sleep Onset: 366.0 min. Desaturations: 982  143.0     Minimum SpO2 TST: 63.0%    Sleep Architecture  % of Time in Bed Stages Time (mins) % Sleep Time  Wake 28.5   Stage N1 12.5 3.0%  Stage N2 104.0 25.2%  Stage N3 290.5 70.4%  REM 5.5 1.3%   Arousal Summary   NREM REM Sleep Index  Respiratory Arousals 815 11 826 120.1  PLM Arousals - - - -  Isolated Limb Movement Arousals - - - -  Snore Arousals 12 - 12 1.7  Spontaneous Arousals 19 1 20  2.9  Total 846 12 858 124.8   Limb Movement Summary   Count Index  Isolated Limb Movements - -  Periodic Limb Movements (PLMs) - -  Total Limb Movements - -    Respiratory Summary   By Sleep Stage By Body Position Total   NREM REM Supine Non-Supine   Time (min) 407.0 5.5 228.5 184.0 412.5         Obstructive Apnea 401 4 250 155 405  Mixed Apnea 160 5 66 99 165  Central Apnea 9 - 3 6 9   Total Apneas 570 9 319 260 579  Total Apnea Index 84.0 98.2 83.8 84.8 84.2         Hypopneas (>=3% Desat and/or Ar.) 393 2 226 169 395  AHI (>=3% Desat and/or Ar.) 142.0 120.0 143.1 139.9 141.7         Hypopneas (>=4% Desat) 366 2 208 160 368  AHI (>=4% Desat) 138.0 120.0 138.4 137.0 137.7          RERAs 11 1 6 6 12   RERA Index 1.6 10.9 1.6 2.0 1.7         RDI 143.6 130.9 144.7 141.8 143.4    Respiratory Event Type Index  Central Apneas 1.3  Obstructive Apneas 58.9  Mixed Apneas 24.0  Central Hypopneas 0.3  Obstructive Hypopneas 54.0  Central Apnea + Hypopnea (CAHI) 1.6  Obstructive Apnea + Hypopnea (OAHI) 140.5   Respiratory Event Durations   Apnea Hypopnea   NREM REM NREM REM  Average (seconds) 16.9 18.7 16.2 23.5  Maximum (seconds) 32.7 23.7 31.2 29.6    Oxygen Saturation Summary   Wake NREM REM TST TIB  Average SpO2 (%) 86.4% 82.4% 78.0% 82.3% 82.5%  Minimum SpO2 (%) 68.0% 63.0% 64.0% 63.0% 63.0%  Maximum SpO2 (%) 97.0% 98.0% 92.0% 98.0% 98.0%   Oxygen Saturation Distribution  Range (%) Time in range (min) Time in range (%)  90.0 - 100.0 88.7 20.5%  80.0 - 90.0  159.6 36.8%  70.0 - 80.0 150.8 34.8%  60.0 - 70.0 30.4 7.0%  50.0 - 60.0 - -  0.0 - 50.0 - -  Time Spent <=88% SpO2  Range (%) Time in range (min) Time in range (%)  0.0 - 88.0 293.1 67.6%      Count Index  Desaturations 982 143.0    Cardiac Summary   Wake NREM REM Sleep Total  Average Pulse Rate (BPM) 110.2 102.8 93.4 102.7 103.0  Minimum Pulse Rate (BPM) 92.0 71.0 80.0 71.0 71.0  Maximum Pulse Rate (BPM) 123.0 121.0 111.0 121.0 123.0   Pulse Rate Distribution:  Range (bpm) Time in range (min) Time in range (%)  0.0 - 40.0 - -  40.0 - 60.0 - -  60.0 - 80.0 1.0 0.2%  80.0 - 100.0 154.7 35.7%  100.0 - 120.0 273.9 63.2%  120.0 - 140.0 0.4 0.1%  140.0 - 200.0 - -      Hypnograms                      Technologist Comments  The patient arrived for a diagnostic PSG study. The patient was placed in room 6. The procedure was explained, and questions were answered.  The patient slept in the left, right, and supine positions. Oral venting was observed. No bruxism, PLMs, seizure, or spike wave activity was observed. Snoring was severe and audible. Tachycardia was observed throughout the entire night.  All stages of sleep were recorded. The patient had two-bathroom breaks.                        Reggy Salt Diplomate, Biomedical engineer of Sleep Medicine  ELECTRONICALLY SIGNED ON:  04/09/2024, 12:00 PM [facility] PH: [phone]   FX: [phone] ACCREDITED BY THE AMERICAN ACADEMY OF SLEEP MEDICINE

## 2024-04-09 NOTE — Procedures (Signed)
 [facility] [address] [place], [place] [address] Tel: [phone]   Fax: [phone]  Polysomnography Interpretation  Patient Name:  Gary French, Gary French Date:  04/04/2024 Referring Physician:  AMY STEPHENS [phone]) %%startinterp%% Indications for Polysomnography The patient is a 33 year old Male who is 5' 5 and weighs 360.0 lbs. His BMI equals 60.0.  A full night polysomnogram was performed to evaluate for -OSA  Medications Taken:  NO MEDICATIONS TAKEN.   Polysomnogram Data A full night polysomnogram recorded the standard physiologic parameters including EEG, EOG, EMG, EKG, nasal and oral airflow.  Respiratory parameters of chest and abdominal movements were recorded with Respiratory Inductance Plethysmography belts.  Oxygen saturation was recorded by pulse oximetry.   Sleep Architecture The total recording time of the polysomnogram was 440.6 minutes.  The total sleep time was 412.5 minutes.  The patient spent 3.0% of total sleep time in Stage N1, 25.2% in Stage N2, 70.4% in Stages N3, and 1.3% in REM.  Sleep latency was 0.0 minutes.  REM latency was 366.0 minutes.  Sleep Efficiency was 93.6%.  Wake after Sleep Onset time was 28.5 minutes.  Respiratory Events The polysomnogram revealed a presence of 405 obstructive, 9 central, and 165 mixed apneas resulting in an Apnea index of 84.2 events per hour.  There were 395 hypopneas (>=3% desaturation and/or arousal) resulting in an Apnea\Hypopnea Index (AHI >=3% desaturation and/or arousal) of 141.7 events per hour.  There were 368 hypopneas (>=4% desaturation) resulting in an Apnea\Hypopnea Index (AHI >=4% desaturation) of 137.7 events per hour.  There were 12 Respiratory Effort Related Arousals resulting in a RERA index of 1.7 events per hour. The Respiratory Disturbance Index is 143.4 events per hour.  The snore index was 290.6 events per hour.  Mean oxygen saturation was 82.5%.  The lowest  oxygen saturation during sleep was 63.0%.  Time spent <=88% oxygen saturation was 293.1 minutes (67.6%).  Limb Activity There were - total limb movements recorded, of this total, - were classified as PLMs.  PLM index was - per hour and PLM associated with Arousals index was - per hour.  Cardiac Summary The average pulse rate was 103.0 bpm.  The minimum pulse rate was 71.0 bpm while the maximum pulse rate was 123.0 bpm.  Cardiac rhythm was abnormal- sinus tachycardia.  Comments: Very severe obstructive sleep apnea, AHI (3%) 141.7/hr. Snoring with oxygen desaturation to a nadir of 63%, mean 82.5% indicating Nocturnal Hypoxemia.  Diagnosis: Obstructive sleep apnea, Nocturnal Hypoxemia  Recommendations: Suggest CPAP titration sleep study, which would allow transition to BIPAP if needed, and would document for insurance if supplemental O2 is also needed.   Signed: Reggy Salt, MD Accredited Clinical Polysomnographer [facility] [address] [place], [place] [address] Tel: [phone]   Fax: [phone] Date/ Time   04/09/24  11:54    Diagnostic PSG Report  Patient Name: Gary French, Gary French Date: 04/04/2024  Date of Birth: 11/26/1990 Study Type: Diagnostic  Age: 42 year MRN #: [id number]  Sex: Male Interpreting Physician: SALT REGGY, [id number]  Height: 5' 5 Referring Physician: AMY STEPHENS ([id number])  Weight: 360.0 lbs Recording Tech: Richard Correia RPSGT  BMI: 60.0 Scoring Tech: Charlie George RPSGT  ESS: 7 Neck Size: 19   Study Overview  Lights Off: 09:40:47 PM  Count Index  Lights On: 05:01:24 AM Awakenings: 23 3.3  Time in Bed: 440.6 min. Arousals: 858 124.8  Total Sleep Time: 412.5 min. AHI (>=3% Desat  and/or Ar.): 974 141.7   Sleep Efficiency: 93.6% AHI (>=4% Desat): 947 137.7   Sleep Latency: 0.0 min. Limb Movements: - -  Wake After Sleep Onset: 28.5 min. Snore: 1998 290.6  REM Latency from Sleep Onset: 366.0 min. Desaturations: 982  143.0     Minimum SpO2 TST: 63.0%    Sleep Architecture  % of Time in Bed Stages Time (mins) % Sleep Time  Wake 28.5   Stage N1 12.5 3.0%  Stage N2 104.0 25.2%  Stage N3 290.5 70.4%  REM 5.5 1.3%   Arousal Summary   NREM REM Sleep Index  Respiratory Arousals 815 11 826 120.1  PLM Arousals - - - -  Isolated Limb Movement Arousals - - - -  Snore Arousals 12 - 12 1.7  Spontaneous Arousals 19 1 20  2.9  Total 846 12 858 124.8   Limb Movement Summary   Count Index  Isolated Limb Movements - -  Periodic Limb Movements (PLMs) - -  Total Limb Movements - -    Respiratory Summary   By Sleep Stage By Body Position Total   NREM REM Supine Non-Supine   Time (min) 407.0 5.5 228.5 184.0 412.5         Obstructive Apnea 401 4 250 155 405  Mixed Apnea 160 5 66 99 165  Central Apnea 9 - 3 6 9   Total Apneas 570 9 319 260 579  Total Apnea Index 84.0 98.2 83.8 84.8 84.2         Hypopneas (>=3% Desat and/or Ar.) 393 2 226 169 395  AHI (>=3% Desat and/or Ar.) 142.0 120.0 143.1 139.9 141.7         Hypopneas (>=4% Desat) 366 2 208 160 368  AHI (>=4% Desat) 138.0 120.0 138.4 137.0 137.7          RERAs 11 1 6 6 12   RERA Index 1.6 10.9 1.6 2.0 1.7         RDI 143.6 130.9 144.7 141.8 143.4    Respiratory Event Type Index  Central Apneas 1.3  Obstructive Apneas 58.9  Mixed Apneas 24.0  Central Hypopneas 0.3  Obstructive Hypopneas 54.0  Central Apnea + Hypopnea (CAHI) 1.6  Obstructive Apnea + Hypopnea (OAHI) 140.5   Respiratory Event Durations   Apnea Hypopnea   NREM REM NREM REM  Average (seconds) 16.9 18.7 16.2 23.5  Maximum (seconds) 32.7 23.7 31.2 29.6    Oxygen Saturation Summary   Wake NREM REM TST TIB  Average SpO2 (%) 86.4% 82.4% 78.0% 82.3% 82.5%  Minimum SpO2 (%) 68.0% 63.0% 64.0% 63.0% 63.0%  Maximum SpO2 (%) 97.0% 98.0% 92.0% 98.0% 98.0%   Oxygen Saturation Distribution  Range (%) Time in range (min) Time in range (%)  90.0 - 100.0 88.7 20.5%  80.0 - 90.0  159.6 36.8%  70.0 - 80.0 150.8 34.8%  60.0 - 70.0 30.4 7.0%  50.0 - 60.0 - -  0.0 - 50.0 - -  Time Spent <=88% SpO2  Range (%) Time in range (min) Time in range (%)  0.0 - 88.0 293.1 67.6%      Count Index  Desaturations 982 143.0    Cardiac Summary   Wake NREM REM Sleep Total  Average Pulse Rate (BPM) 110.2 102.8 93.4 102.7 103.0  Minimum Pulse Rate (BPM) 92.0 71.0 80.0 71.0 71.0  Maximum Pulse Rate (BPM) 123.0 121.0 111.0 121.0 123.0   Pulse Rate Distribution:  Range (bpm) Time in range (min) Time in range (%)  0.0 -  40.0 - -  40.0 - 60.0 - -  60.0 - 80.0 1.0 0.2%  80.0 - 100.0 154.7 35.7%  100.0 - 120.0 273.9 63.2%  120.0 - 140.0 0.4 0.1%  140.0 - 200.0 - -      Hypnograms                      Technologist Comments  The patient arrived for a diagnostic PSG study. The patient was placed in room 6. The procedure was explained, and questions were answered.  The patient slept in the left, right, and supine positions. Oral venting was observed. No bruxism, PLMs, seizure, or spike wave activity was observed. Snoring was severe and audible. Tachycardia was observed throughout the entire night.  All stages of sleep were recorded. The patient had two-bathroom breaks.                           Reggy Salt Diplomate, Biomedical engineer of Sleep Medicine  ELECTRONICALLY SIGNED ON:  04/09/2024, 11:47 AM [facility] PH: [phone]   FX: [phone] ACCREDITED BY THE AMERICAN ACADEMY OF SLEEP MEDICINE

## 2024-04-09 NOTE — Procedures (Signed)
  Indications for Polysomnography The patient is a 33 year old Male who is 5' 5 and weighs 360.0 lbs. His BMI equals 60.0.  A full night polysomnogram was performed to evaluate for -.  Medications Taken:NO MEDICATIONS TAKEN. Polysomnogram Data A full night polysomnogram recorded the standard physiologic parameters including EEG, EOG, EMG, EKG, nasal and oral airflow.  Respiratory parameters of chest and abdominal movements were recorded with Respiratory Inductance Plethysmography belts.   Oxygen saturation was recorded by pulse oximetry.  Sleep Architecture The total recording time of the polysomnogram was 440.6 minutes.  The total sleep time was 412.5 minutes.  The patient spent 3.0% of total sleep time in Stage N1, 25.2% in Stage N2, 70.4% in Stages N3, and 1.3% in REM.  Sleep latency was 0.0 minutes.   REM latency was 366.0 minutes.  Sleep Efficiency was 93.6%.  Wake after Sleep Onset time was 28.5 minutes.  Respiratory Events The polysomnogram revealed a presence of 405 obstructive, 9 central, and 165 mixed apneas resulting in an Apnea index of 84.2 events per hour.  There were 395 hypopneas (GreaterEqual to3% desaturation and/or arousal) resulting in an Apnea\Hypopnea Index  (AHI GreaterEqual to3% desaturation and/or arousal) of 141.7 events per hour.  There were 368 hypopneas (GreaterEqual to4% desaturation) resulting in an Apnea\Hypopnea Index (AHI GreaterEqual to4% desaturation) of 137.7 events per hour.  There were 12  Respiratory Effort Related Arousals resulting in a RERA index of 1.7 events per hour. The Respiratory Disturbance Index is 143.4 events per hour.  The snore index was 290.6 events per hour.  Mean oxygen saturation was 82.5%.  The lowest oxygen saturation during sleep was 63.0%.  Time spent LessEqual to88% oxygen saturation was  minutes ().  Limb Activity There were - total limb movements recorded, of this total, - were classified as PLMs.  PLM index was - per hour and PLM  associated with Arousals index was - per hour.  Cardiac Summary The average pulse rate was 103.0 bpm.  The minimum pulse rate was 71.0 bpm while the maximum pulse rate was 123.0 bpm.  Cardiac rhythm was normal/abnormal.  Comments:  Diagnosis:  Recommendations:   This study was personally reviewed and electronically signed by: NEYSA REGGY BIRCH, MD Accredited Board Certified in Sleep Medicine Date/Time:

## 2024-04-10 ENCOUNTER — Ambulatory Visit: Payer: Self-pay | Admitting: Family

## 2024-04-28 ENCOUNTER — Ambulatory Visit (INDEPENDENT_AMBULATORY_CARE_PROVIDER_SITE_OTHER): Admitting: Family

## 2024-04-28 ENCOUNTER — Encounter: Payer: Self-pay | Admitting: Family

## 2024-04-28 VITALS — BP 119/82 | HR 105 | Temp 98.0°F | Resp 16 | Ht 65.0 in | Wt 363.4 lb

## 2024-04-28 DIAGNOSIS — Z13220 Encounter for screening for lipoid disorders: Secondary | ICD-10-CM

## 2024-04-28 DIAGNOSIS — Z131 Encounter for screening for diabetes mellitus: Secondary | ICD-10-CM

## 2024-04-28 DIAGNOSIS — Z130 Encounter for screening for diseases of the blood and blood-forming organs and certain disorders involving the immune mechanism: Secondary | ICD-10-CM

## 2024-04-28 DIAGNOSIS — Z1329 Encounter for screening for other suspected endocrine disorder: Secondary | ICD-10-CM

## 2024-04-28 DIAGNOSIS — Z13228 Encounter for screening for other metabolic disorders: Secondary | ICD-10-CM

## 2024-04-28 DIAGNOSIS — Z23 Encounter for immunization: Secondary | ICD-10-CM

## 2024-04-28 NOTE — Progress Notes (Signed)
 Patient stated he supposed to have blood work done

## 2024-04-28 NOTE — Progress Notes (Signed)
 Patient ID: Gary French, male    DOB: 06/25/1991  MRN: [id number]  CC: Labs  Subjective: Gary French is a 33 y.o. male who presents for labs.   His concerns today include:  Patient confirms he does not need medication refills on today. Patient states he is present for labs. It is noted patient had an office visit for annual physical exam on 12/20/2023 with Raguel Blush, MD. Labs were ordered during the same office visit. However, there are no lab results for review for 12/20/2023.  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 60 g 2   clotrimazole -betamethasone  (LOTRISONE ) cream Apply 1 Application topically daily. 30 g 0   doxycycline  (VIBRA -TABS) 100 MG tablet Take 1 tablet (100 mg total) by mouth 2 (two) times daily. 20 tablet 0   meloxicam  (MOBIC ) 15 MG tablet Take 1 tablet (15 mg total) by mouth daily. 30 tablet 0   methylPREDNISolone  (MEDROL  DOSEPAK) 4 MG TBPK tablet Take as directed 21 each 0   terbinafine  (LAMISIL ) 250 MG tablet Take 1 tablet (250 mg total) by mouth daily. 30 tablet 0   No current facility-administered medications on file prior to visit.    No Known Allergies  Social History   Socioeconomic History   Marital status: Unknown    Spouse name: Not on file   Number of children: Not on file   Years of education: Not on file   Highest education level: Not on file  Occupational History   Not on file  Tobacco Use   Smoking status: Every Day    Current packs/day: 0.25    Average packs/day: 0.3 packs/day for 15.0 years (3.8 ttl pk-yrs)    Types: Cigarettes   Smokeless tobacco: Never  Vaping Use   Vaping status: Never Used  Substance and Sexual Activity   Alcohol use: Yes   Drug use: No   Sexual  activity: Not Currently  Other Topics Concern   Not on file  Social History Narrative   Not on file   Social Drivers of Health   Financial Resource Strain: Low Risk  (11/26/2023)   Overall Financial Resource Strain (CARDIA)    Difficulty of Paying Living Expenses: Not hard at all  Food Insecurity: No Food Insecurity (11/26/2023)   Hunger Vital Sign    Worried About Running Out of Food in the Last Year: Never true    Ran Out of Food in the Last Year: Never true  Transportation Needs: No Transportation Needs (11/26/2023)   PRAPARE - Administrator, Civil Service (Medical): No    Lack of Transportation (Non-Medical): No  Physical Activity: Insufficiently Active (11/26/2023)   Exercise Vital Sign    Days of Exercise per Week: 3 days    Minutes of Exercise per Session: 30 min  Stress: No Stress Concern Present (11/26/2023)   [employer] of Occupational Health - Occupational Stress Questionnaire    Feeling of Stress : Not at all  Social Connections: Socially Isolated (11/26/2023)   Social Connection and Isolation Panel    Frequency of Communication with Friends and Family: More than three times a week    Frequency of Social Gatherings with Friends and Family: More than three times a week  Attends Religious Services: Never    Active Member of Clubs or Organizations: No    Attends Banker Meetings: Never    Marital Status: Never married  Intimate Partner Violence: Not At Risk (11/26/2023)   Humiliation, Afraid, Rape, and Kick questionnaire    Fear of Current or Ex-Partner: No    Emotionally Abused: No    Physically Abused: No    Sexually Abused: No    History reviewed. No pertinent family history.  Past Surgical History:  Procedure Laterality Date   NO PAST SURGERIES      ROS: Review of Systems Negative except as stated above  PHYSICAL EXAM: BP 119/82   Pulse (!) 105   Temp 98 F (36.7 C) (Oral)   Resp 16   Ht 5' 5 (1.651 m)   Wt (!) 363  lb 6.4 oz (164.8 kg)   SpO2 93%   BMI 60.47 kg/m   Physical Exam HENT:     Head: Normocephalic and atraumatic.     Nose: Nose normal.     Mouth/Throat:     Mouth: Mucous membranes are moist.     Pharynx: Oropharynx is clear.  Eyes:     Extraocular Movements: Extraocular movements intact.     Conjunctiva/sclera: Conjunctivae normal.     Pupils: Pupils are equal, round, and reactive to light.  Cardiovascular:     Rate and Rhythm: Tachycardia present.     Pulses: Normal pulses.     Heart sounds: Normal heart sounds.  Pulmonary:     Effort: Pulmonary effort is normal.     Breath sounds: Normal breath sounds.  Musculoskeletal:        General: Normal range of motion.     Cervical back: Normal range of motion and neck supple.  Neurological:     General: No focal deficit present.     Mental Status: He is alert and oriented to person, place, and time.  Psychiatric:        Mood and Affect: Mood normal.        Behavior: Behavior normal.     ASSESSMENT AND PLAN: 1. Screening for metabolic disorder (Primary) - Routine screening.  - CMP14+EGFR  2. Screening for deficiency anemia - Routine screening.  - CBC  3. Diabetes mellitus screening - Routine screening.  - Hemoglobin A1c  4. Screening cholesterol level - Routine screening.  - Lipid panel  5. Thyroid disorder screen - Routine screening.  - TSH  6. Immunization due - Administered.  - Flu vaccine trivalent PF, 6mos and older(Flulaval,Afluria,Fluarix,Fluzone)   Patient was given the opportunity to ask questions.  Patient verbalized understanding of the plan and was able to repeat key elements of the plan. Patient was given clear instructions to go to Emergency Department or return to medical center if symptoms don't improve, worsen, or new problems develop.The patient verbalized understanding.   Orders Placed This Encounter  Procedures   Flu vaccine trivalent PF, 6mos and older(Flulaval,Afluria,Fluarix,Fluzone)    CBC   Lipid panel   CMP14+EGFR   Hemoglobin A1c   TSH    Return for Follow-up as needed.  Greig JINNY Drones, NP

## 2024-04-29 LAB — CMP14+EGFR
ALT: 34 IU/L (ref 0–44)
AST: 18 IU/L (ref 0–40)
Albumin: 4.6 g/dL (ref 4.1–5.1)
Alkaline Phosphatase: 71 IU/L (ref 47–123)
BUN/Creatinine Ratio: 17 (ref 9–20)
BUN: 15 mg/dL (ref 6–20)
Bilirubin Total: 0.6 mg/dL (ref 0.0–1.2)
CO2: 24 mmol/L (ref 20–29)
Calcium: 9.3 mg/dL (ref 8.7–10.2)
Chloride: 102 mmol/L (ref 96–106)
Creatinine, Ser: 0.89 mg/dL (ref 0.76–1.27)
Globulin, Total: 2.7 g/dL (ref 1.5–4.5)
Glucose: 106 mg/dL — ABNORMAL HIGH (ref 70–99)
Potassium: 4.7 mmol/L (ref 3.5–5.2)
Sodium: 140 mmol/L (ref 134–144)
Total Protein: 7.3 g/dL (ref 6.0–8.5)
eGFR: 115 mL/min/1.73 (ref >59)

## 2024-04-29 LAB — LIPID PANEL
Chol/HDL Ratio: 2.6 ratio (ref 0.0–5.0)
Cholesterol, Total: 135 mg/dL (ref 100–199)
HDL: 51 mg/dL (ref >39)
LDL Chol Calc (NIH): 72 mg/dL (ref 0–99)
Triglycerides: 53 mg/dL (ref 0–149)
VLDL Cholesterol Cal: 12 mg/dL (ref 5–40)

## 2024-04-29 LAB — CBC
Hematocrit: 49.2 % (ref 37.5–51.0)
Hemoglobin: 15.7 g/dL (ref 13.0–17.7)
MCH: 28.4 pg (ref 26.6–33.0)
MCHC: 31.9 g/dL (ref 31.5–35.7)
MCV: 89 fL (ref 79–97)
Platelets: 259 x10E3/uL (ref 150–450)
RBC: 5.53 x10E6/uL (ref 4.14–5.80)
RDW: 13.1 % (ref 11.6–15.4)
WBC: 8.2 x10E3/uL (ref 3.4–10.8)

## 2024-04-29 LAB — HEMOGLOBIN A1C
Est. average glucose Bld gHb Est-mCnc: 137 mg/dL
Hgb A1c MFr Bld: 6.4 % — ABNORMAL HIGH (ref 4.8–5.6)

## 2024-04-29 LAB — TSH: TSH: 2.47 u[IU]/mL (ref 0.450–4.500)

## 2024-05-01 ENCOUNTER — Ambulatory Visit: Payer: Self-pay | Admitting: Family

## 2024-05-24 ENCOUNTER — Telehealth: Payer: Self-pay

## 2024-05-24 NOTE — Telephone Encounter (Signed)
 Ok to establish

## 2024-05-24 NOTE — Telephone Encounter (Signed)
 Patient's sister, [name] is a current Dr. Mahlon patient and she would like to see if Dr. Mahlon would accept her brother as a new patient

## 2024-05-24 NOTE — Telephone Encounter (Signed)
 Please call patient to inform him of the okay and to schedule appointment

## 2024-05-25 NOTE — Telephone Encounter (Signed)
 Called, LM to call back to schedule NP appt

## 2024-06-06 ENCOUNTER — Telehealth: Payer: Self-pay | Admitting: Family

## 2024-06-06 NOTE — Telephone Encounter (Signed)
 Pt scheduled on next available appt and added to waitlist

## 2024-06-06 NOTE — Telephone Encounter (Unsigned)
 Copied from CRM [id number]. Topic: General - Other >> Jun 06, 2024 10:25 AM Donee H wrote: Reason for CRM: Patient called to state he has FMLA forms for pcp to fill out. He would like to know if he needs an appointment or can he just drop off forms(and when). Please follow back up with patient.  [phone]

## 2024-06-07 ENCOUNTER — Telehealth: Payer: Self-pay

## 2024-06-07 NOTE — Telephone Encounter (Signed)
 Copied from CRM [id number]. Topic: General - Other >> Jun 07, 2024 12:24 PM Shanda MATSU wrote: Reason for CRM: Patient is req a call back in regards to [facility] paperwork, patient stated that the last time he went in to have FMLA paperwork filled out he wasn't even examined, he stated paperwork was just filled out.

## 2024-06-07 NOTE — Telephone Encounter (Signed)
 Copied from CRM #[id number]. Topic: Appointments - Scheduling Inquiry for Clinic >> Jun 06, 2024  4:39 PM Wess RAMAN wrote: Reason for CRM: Patient stated he does not need to be seen for an appt. He would like to know if he could drop off his FMLA paperwork that is due by Friday, 06/09/24. Advised that FMLA paperwork may take up to 7-10 business days.  Callback #: [id number]

## 2024-06-07 NOTE — Telephone Encounter (Signed)
 Called pt and let him know he will need appt for FMLA paperwork; could not reach but left vm.

## 2024-06-21 ENCOUNTER — Encounter: Payer: Self-pay | Admitting: Family

## 2024-06-21 ENCOUNTER — Ambulatory Visit (INDEPENDENT_AMBULATORY_CARE_PROVIDER_SITE_OTHER): Admitting: Family

## 2024-06-21 VITALS — BP 115/73 | HR 112 | Temp 98.5°F | Resp 20 | Ht 65.0 in | Wt 366.8 lb

## 2024-06-21 DIAGNOSIS — M79671 Pain in right foot: Secondary | ICD-10-CM

## 2024-06-21 DIAGNOSIS — M79672 Pain in left foot: Secondary | ICD-10-CM

## 2024-06-21 DIAGNOSIS — Z0289 Encounter for other administrative examinations: Secondary | ICD-10-CM

## 2024-06-21 NOTE — Progress Notes (Signed)
 Patient ID: Gary French, male    DOB: [birth year]  MRN: [id number]  CC: Paperwork  Subjective: Gary French is a 33 y.o. male who presents for paperwork.   His concerns today include:  Patient presents for completion of Certification of Health Care Provider for Employee's Serious Health Condition under the Family and Medical Leave Act. Patient is established with [facility] at [facility] (last office visit 01/12/2024). There are no follow-ups on file for the same. Patient states he thought Primary Care to complete paperwork today and not established Podiatry.   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 60 g 2   clotrimazole -betamethasone  (LOTRISONE ) cream Apply 1 Application topically daily. 30 g 0   doxycycline  (VIBRA -TABS) 100 MG tablet Take 1 tablet (100 mg total) by mouth 2 (two) times daily. 20 tablet 0   meloxicam  (MOBIC ) 15 MG tablet Take 1 tablet (15 mg total) by mouth daily. 30 tablet 0   methylPREDNISolone  (MEDROL  DOSEPAK) 4 MG TBPK tablet Take as directed 21 each 0   terbinafine  (LAMISIL ) 250 MG tablet Take 1 tablet (250 mg total) by mouth daily. 30 tablet 0   No current facility-administered medications on file prior to visit.    No Known Allergies  Social History   Socioeconomic History   Marital status: Unknown    Spouse name: Not on file   Number of children: Not on file   Years of education: Not on file   Highest education level: Not on file  Occupational History   Not on file  Tobacco Use   Smoking status: Every Day    Current packs/day: 0.25    Average packs/day: 0.3 packs/day for 15.0 years (3.8 ttl pk-yrs)    Types: Cigarettes   Smokeless tobacco: Never  Vaping Use    Vaping status: Never Used  Substance and Sexual Activity   Alcohol use: Yes   Drug use: No   Sexual activity: Not Currently  Other Topics Concern   Not on file  Social History Narrative   Not on file   Social Drivers of Health   Financial Resource Strain: Low Risk  (11/26/2023)   Overall Financial Resource Strain (CARDIA)    Difficulty of Paying Living Expenses: Not hard at all  Food Insecurity: No Food Insecurity (11/26/2023)   Hunger Vital Sign    Worried About Running Out of Food in the Last Year: Never true    Ran Out of Food in the Last Year: Never true  Transportation Needs: No Transportation Needs (11/26/2023)   PRAPARE - Administrator, Civil Service (Medical): No    Lack of Transportation (Non-Medical): No  Physical Activity: Insufficiently Active (11/26/2023)   Exercise Vital Sign    Days of Exercise per Week: 3 days    Minutes of Exercise per Session: 30 min  Stress: No Stress Concern Present (11/26/2023)   [employer] of Occupational Health - Occupational Stress Questionnaire    Feeling of Stress : Not at all  Social Connections: Socially Isolated (11/26/2023)   Social Connection and Isolation Panel    Frequency of Communication with Friends and Family: More than three times a week    Frequency of Social Gatherings with Friends and  Family: More than three times a week    Attends Religious Services: Never    Active Member of Clubs or Organizations: No    Attends Banker Meetings: Never    Marital Status: Never married  Intimate Partner Violence: Not At Risk (11/26/2023)   Humiliation, Afraid, Rape, and Kick questionnaire    Fear of Current or Ex-Partner: No    Emotionally Abused: No    Physically Abused: No    Sexually Abused: No    History reviewed. No pertinent family history.  Past Surgical History:  Procedure Laterality Date   NO PAST SURGERIES      ROS: Review of Systems Negative except as stated above  PHYSICAL EXAM: BP  115/73   Pulse (!) 112   Temp 98.5 F (36.9 C) (Oral)   Resp 20   Ht 5' 5 (1.651 m)   Wt (!) 366 lb 12.8 oz (166.4 kg)   SpO2 93%   BMI 61.04 kg/m   Physical Exam HENT:     Head: Normocephalic and atraumatic.     Nose: Nose normal.     Mouth/Throat:     Mouth: Mucous membranes are moist.     Pharynx: Oropharynx is clear.  Eyes:     Extraocular Movements: Extraocular movements intact.     Conjunctiva/sclera: Conjunctivae normal.     Pupils: Pupils are equal, round, and reactive to light.  Cardiovascular:     Rate and Rhythm: Tachycardia present.     Pulses: Normal pulses.     Heart sounds: Normal heart sounds.  Pulmonary:     Effort: Pulmonary effort is normal.     Breath sounds: Normal breath sounds.  Musculoskeletal:        General: Normal range of motion.     Cervical back: Normal range of motion and neck supple.     Right hip: Normal.     Left hip: Normal.     Right upper leg: Normal.     Left upper leg: Normal.     Right knee: Normal.     Left knee: Normal.     Right lower leg: Normal.     Left lower leg: Normal.     Right ankle: Normal.     Left ankle: Normal.     Right foot: Normal.     Left foot: Normal.  Neurological:     General: No focal deficit present.     Mental Status: He is alert and oriented to person, place, and time.  Psychiatric:        Mood and Affect: Mood normal.        Behavior: Behavior normal.     ASSESSMENT AND PLAN: 1. Encounter for completion of form with patient (Primary) 2. Pain in both feet - Certification of Health Care Provider for Employee's Serious Health Condition under the Family and Medical Leave Act completed today in office. Beginning 06/21/2024. Ending 12/20/2023. - I discussed with patient in detail any future paperwork and any additional time requested away from work related to bilateral feet pain to be completed by established Podiatry. Patient verbalized understanding/agreement.    Patient was given the  opportunity to ask questions.  Patient verbalized understanding of the plan and was able to repeat key elements of the plan. Patient was given clear instructions to go to Emergency Department or return to medical center if symptoms don't improve, worsen, or new problems develop.The patient verbalized understanding.   Return for Follow-up as needed.  Bawi Lakins JINNY Chute,  NP

## 2024-06-27 NOTE — Progress Notes (Deleted)
 Cardiology Office Note:    Date:  06/27/2024   ID:  Gary French, DOB 10-Jul-1991, MRN [id number]  PCP:  Gary Gary PARAS, NP   [facility] Providers Cardiologist:  None { Click to update primary MD,subspecialty MD or APP then REFRESH:1}    Referring MD: Gary Gary PARAS, NP   No chief complaint on file. ***  History of Present Illness:    Gary French is a 33 y.o. male is seen at the request of Gary Jaycee NP for evaluation of HTN.   Past Medical History:  Diagnosis Date   Anxiety     Past Surgical History:  Procedure Laterality Date   NO PAST SURGERIES      Current Medications: No outpatient medications have been marked as taking for the 07/03/24 encounter (Appointment) with Gary Schuitema M, MD.     Allergies:   Patient has no known allergies.   Social History   Socioeconomic History   Marital status: Unknown    Spouse name: Not on file   Number of children: Not on file   Years of education: Not on file   Highest education level: Not on file  Occupational History   Not on file  Tobacco Use   Smoking status: Every Day    Current packs/day: 0.25    Average packs/day: 0.3 packs/day for 15.0 years (3.8 ttl pk-yrs)    Types: Cigarettes   Smokeless tobacco: Never  Vaping Use   Vaping status: Never Used  Substance and Sexual Activity   Alcohol use: Yes   Drug use: No   Sexual activity: Not Currently  Other Topics Concern   Not on file  Social History Narrative   Not on file   Social Drivers of French   Financial Resource Strain: Low Risk  (11/26/2023)   Overall Financial Resource Strain (CARDIA)    Difficulty of Paying Living Expenses: Not hard at all  Food Insecurity: No Food Insecurity (11/26/2023)   Hunger Vital Sign    Worried About Running Out of Food in the Last Year: Never true    Ran Out of Food in the Last Year: Never true  Transportation Needs: No Transportation Needs (11/26/2023)   PRAPARE - Administrator, Civil Service  (Medical): No    Lack of Transportation (Non-Medical): No  Physical Activity: Insufficiently Active (11/26/2023)   Exercise Vital Sign    Days of Exercise per Week: 3 days    Minutes of Exercise per Session: 30 min  Stress: No Stress Concern Present (11/26/2023)   Gary French - Occupational Stress Questionnaire    Feeling of Stress : Not at all  Social Connections: Socially Isolated (11/26/2023)   Social Connection and Isolation Panel    Frequency of Communication with Friends and Family: More than three times a week    Frequency of Social Gatherings with Friends and Family: More than three times a week    Attends Religious Services: Never    Database Administrator or Organizations: No    Attends Banker Meetings: Never    Marital Status: Never married     Family History: The patient's ***family history is not on file.  ROS:   Please see the history of present illness.    *** All other systems reviewed and are negative.  EKGs/Labs/Other Studies Reviewed:    The following studies were reviewed today: ***      Recent Labs: 04/28/2024: ALT 34; BUN 15; Creatinine, Ser 0.89;  Hemoglobin 15.7; Platelets 259; Potassium 4.7; Sodium 140; TSH 2.470  Recent Lipid Panel    Component Value Date/Time   CHOL 135 04/28/2024 0000   TRIG 53 04/28/2024 0000   HDL 51 04/28/2024 0000   CHOLHDL 2.6 04/28/2024 0000   LDLCALC 72 04/28/2024 0000     Risk Assessment/Calculations:   {Does this patient have ATRIAL FIBRILLATION?:[id number]}  No BP recorded.  {Refresh Note OR Click here to enter BP  :1}***         Physical Exam:    VS:  There were no vitals taken for this visit.    Wt Readings from Last 3 Encounters:  06/21/24 (!) 366 lb 12.8 oz (166.4 kg)  04/28/24 (!) 363 lb 6.4 oz (164.8 kg)  04/04/24 (!) 360 lb (163.3 kg)     GEN: *** Well nourished, well developed in no acute distress HEENT: Normal NECK: No JVD; No carotid  bruits LYMPHATICS: No lymphadenopathy CARDIAC: ***RRR, no murmurs, rubs, gallops RESPIRATORY:  Clear to auscultation without rales, wheezing or rhonchi  ABDOMEN: Soft, non-tender, non-distended MUSCULOSKELETAL:  No edema; No deformity  SKIN: Warm and dry NEUROLOGIC:  Alert and oriented x 3 PSYCHIATRIC:  Normal affect   ASSESSMENT:    No diagnosis found. PLAN:    In order of problems listed above:  ***      {Are you ordering a CV Procedure (e.g. stress test, cath, DCCV, TEE, etc)?   Press F2        :[id number]}    Medication Adjustments/Labs and Tests Ordered: Current medicines are reviewed at length with the patient today.  Concerns regarding medicines are outlined above.  No orders of the defined types were placed in this encounter.  No orders of the defined types were placed in this encounter.   There are no Patient Instructions on file for this visit.   Signed, Gary Hedger, MD  06/27/2024 2:01 PM    [facility]

## 2024-07-03 ENCOUNTER — Ambulatory Visit: Attending: Cardiology | Admitting: Cardiology

## 2024-07-04 ENCOUNTER — Encounter: Payer: Self-pay | Admitting: Cardiology

## 2024-07-12 ENCOUNTER — Ambulatory Visit: Admitting: Family

## 2024-07-28 ENCOUNTER — Encounter: Payer: Self-pay | Admitting: Cardiology

## 2024-07-28 ENCOUNTER — Ambulatory Visit: Admitting: Cardiology

## 2024-07-28 VITALS — BP 119/80 | HR 113 | Ht 66.0 in | Wt 376.0 lb

## 2024-07-28 DIAGNOSIS — R000 Tachycardia, unspecified: Secondary | ICD-10-CM | POA: Diagnosis not present

## 2024-07-28 DIAGNOSIS — F17210 Nicotine dependence, cigarettes, uncomplicated: Secondary | ICD-10-CM | POA: Diagnosis not present

## 2024-07-28 DIAGNOSIS — Z0130 Encounter for examination of blood pressure without abnormal findings: Secondary | ICD-10-CM | POA: Insufficient documentation

## 2024-07-28 NOTE — Patient Instructions (Signed)
 Medication Instructions:  Your physician recommends that you continue on your current medications as directed. Please refer to the Current Medication list given to you today.  *If you need a refill on your cardiac medications before your next appointment, please call your pharmacy*  Lab Work: None.  If you have labs (blood work) drawn today and your tests are completely normal, you will receive your results only by: MyChart Message (if you have MyChart) OR A paper copy in the mail If you have any lab test that is abnormal or we need to change your treatment, we will call you to review the results.  Testing/Procedures: Your physician has requested that you have an echocardiogram. Echocardiography is a painless test that uses sound waves to create images of your heart. It provides your doctor with information about the size and shape of your heart and how well your hearts chambers and valves are working. This procedure takes approximately one hour. There are no restrictions for this procedure. Please do NOT wear cologne, perfume, aftershave, or lotions (deodorant is allowed). Please arrive 15 minutes prior to your appointment time.  Please note: We ask at that you not bring children with you during ultrasound (echo/ vascular) testing. Due to room size and safety concerns, children are not allowed in the ultrasound rooms during exams. Our front office staff cannot provide observation of children in our lobby area while testing is being conducted. An adult accompanying a patient to their appointment will only be allowed in the ultrasound room at the discretion of the ultrasound technician under special circumstances. We apologize for any inconvenience.    Follow-Up: At [facility], you and your health needs are our priority.  As part of our continuing mission to provide you with exceptional heart care, our providers are all part of one team.  This team includes your primary Cardiologist  (physician) and Advanced Practice Providers or APPs (Physician Assistants and Nurse Practitioners) who all work together to provide you with the care you need, when you need it.  Your next appointment will be as needed with:     Provider:   Dr. Newman Lawrence, MD

## 2024-07-28 NOTE — Progress Notes (Signed)
 " Cardiology Office Note:  .   Date:  07/28/2024  ID:  Gary French, DOB [birth year], MRN [id number] PCP: Jaycee Greig PARAS, NP  [facility] Providers Cardiologist:  Newman Lawrence, MD PCP: Jaycee Greig PARAS, NP  Chief Complaint  Patient presents with   Tachycardia     Gary French is a 33 y.o. male with obesity, prediabetes, tachycardia, nicotine dependence  Discussed the use of AI scribe software for clinical note transcription with the patient, who gave verbal consent to proceed.  History of Present Illness Gary French is a 33 year old male who presents for a routine checkup and evaluation of prediabetes.  He is concerned about weight gain and overall health. He has no chest pain or shortness of breath. He smokes about one pack every three to four days and drinks one energy drink daily. His diet is high in candy and chocolate, and he eats out two to three times per week.      Vitals:   07/28/24 1541  BP: 119/80  Pulse: (!) 113  SpO2: 94%      Review of Systems  Cardiovascular:  Negative for chest pain, dyspnea on exertion, leg swelling, palpitations and syncope.        Studies Reviewed: SABRA        EKG 07/28/2024: Sinus tachycardia 113 bpm No previous ECGs available    Labs 04/2024: Chol 135, TG 53, HDL 51, LDL 72 HbA1C 6.4% Hb 15.7 Cr 0.89 TSH 2.4   Physical Exam Vitals and nursing note reviewed.  Constitutional:      General: He is not in acute distress.    Appearance: He is obese.  Neck:     Vascular: No JVD.  Cardiovascular:     Rate and Rhythm: Regular rhythm. Tachycardia present.     Heart sounds: Normal heart sounds. No murmur heard. Pulmonary:     Effort: Pulmonary effort is normal.     Breath sounds: Normal breath sounds. No wheezing or rales.  Musculoskeletal:     Right lower leg: Edema (Trace) present.     Left lower leg: Edema (Trace) present.      VISIT DIAGNOSES:   ICD-10-CM   1. Sinus tachycardia  R00.0 EKG  12-Lead    ECHOCARDIOGRAM COMPLETE    2. Nicotine dependence, cigarettes, uncomplicated  F17.210        Gary French is a 33 y.o. male with obesity, prediabetes, tachycardia, nicotine dependence  Assessment & Plan Cardiovascular risk assessment and management: Elevated heart rate likely due to energy drink consumption. No current evidence of heart disease. Risk factors include prediabetes and nicotine dependence. - Ordered echocardiogram to assess heart function. - Advised reduction of energy drink consumption. - Recommended weight loss and improved blood sugar control.  Prediabetes: Risk of progression to diabetes if lifestyle changes are not implemented. Discussed dietary modifications and potential use of metformin. - Advised dietary modifications to reduce intake of sweets, candies, white bread, pasta, and pizza. - Recommended consultation with primary doctor regarding initiation of metformin. - Discussed potential use of weight loss medications with primary doctor.  Nicotine dependence: Tobacco cessation counseling: - Currently smoking 1/3 packs/day   - Patient was informed of the dangers of tobacco abuse including stroke, cancer, and MI, as well as benefits of tobacco cessation. - Patient is willing to quit at this time. - Approximately 5 mins were spent counseling patient cessation techniques. We discussed various methods to help quit smoking, including deciding on a date to quit, joining  a support group, pharmacological agents. Patient would like to quit on his own. - I will reassess his progress at the next follow-up visit        F/u as needed  Signed, Newman JINNY Lawrence, MD  "

## 2024-08-30 ENCOUNTER — Ambulatory Visit (HOSPITAL_COMMUNITY): Admission: RE | Admit: 2024-08-30 | Source: Ambulatory Visit

## 2024-09-21 ENCOUNTER — Ambulatory Visit (HOSPITAL_COMMUNITY)
